# Patient Record
Sex: Male | Born: 1935 | Race: White | Hispanic: No | Smoking: Never smoker
Health system: Southern US, Community
[De-identification: ages and names within clinical notes are randomized; demographics above are authoritative.]

## PROBLEM LIST (undated history)

## (undated) DIAGNOSIS — M19032 Primary osteoarthritis, left wrist: Secondary | ICD-10-CM

## (undated) DIAGNOSIS — N183 Chronic kidney disease, stage 3 unspecified: Secondary | ICD-10-CM

## (undated) DIAGNOSIS — Z8551 Personal history of malignant neoplasm of bladder: Secondary | ICD-10-CM

## (undated) DIAGNOSIS — F0390 Unspecified dementia without behavioral disturbance: Secondary | ICD-10-CM

## (undated) DIAGNOSIS — Z8679 Personal history of other diseases of the circulatory system: Secondary | ICD-10-CM

## (undated) DIAGNOSIS — R32 Unspecified urinary incontinence: Secondary | ICD-10-CM

## (undated) DIAGNOSIS — Z87438 Personal history of other diseases of male genital organs: Secondary | ICD-10-CM

## (undated) DIAGNOSIS — M170 Bilateral primary osteoarthritis of knee: Secondary | ICD-10-CM

## (undated) DIAGNOSIS — Z8639 Personal history of other endocrine, nutritional and metabolic disease: Secondary | ICD-10-CM

## (undated) HISTORY — DX: Personal history of malignant neoplasm of bladder: Z85.51

## (undated) HISTORY — DX: Personal history of other diseases of male genital organs: Z87.438

## (undated) HISTORY — DX: Unspecified dementia, unspecified severity, without behavioral disturbance, psychotic disturbance, mood disturbance, and anxiety: F03.90

## (undated) HISTORY — DX: Unspecified urinary incontinence: R32

## (undated) HISTORY — DX: Personal history of other diseases of the circulatory system: Z86.79

## (undated) HISTORY — DX: Chronic kidney disease, stage 3 unspecified: N18.30

## (undated) HISTORY — PX: CHOLECYSTECTOMY: SHX55

## (undated) HISTORY — DX: Personal history of other endocrine, nutritional and metabolic disease: Z86.39

## (undated) HISTORY — DX: Bilateral primary osteoarthritis of knee: M17.0

## (undated) HISTORY — DX: Primary osteoarthritis, left wrist: M19.032

## (undated) HISTORY — PX: SHOULDER SURGERY: SHX246

## (undated) HISTORY — PX: HERNIA REPAIR: SHX51

## (undated) HISTORY — DX: Chronic kidney disease, stage 3 (moderate): N18.3

---

## 1997-12-27 ENCOUNTER — Ambulatory Visit (HOSPITAL_BASED_OUTPATIENT_CLINIC_OR_DEPARTMENT_OTHER): Admission: RE | Admit: 1997-12-27 | Discharge: 1997-12-27 | Payer: Self-pay | Admitting: Urology

## 1998-04-01 ENCOUNTER — Ambulatory Visit (HOSPITAL_COMMUNITY): Admission: RE | Admit: 1998-04-01 | Discharge: 1998-04-01 | Payer: Self-pay | Admitting: Urology

## 1998-04-08 ENCOUNTER — Ambulatory Visit (HOSPITAL_COMMUNITY): Admission: RE | Admit: 1998-04-08 | Discharge: 1998-04-08 | Payer: Self-pay | Admitting: Urology

## 1998-04-09 ENCOUNTER — Ambulatory Visit (HOSPITAL_COMMUNITY): Admission: RE | Admit: 1998-04-09 | Discharge: 1998-04-09 | Payer: Self-pay | Admitting: Urology

## 1998-04-18 ENCOUNTER — Ambulatory Visit (HOSPITAL_BASED_OUTPATIENT_CLINIC_OR_DEPARTMENT_OTHER): Admission: RE | Admit: 1998-04-18 | Discharge: 1998-04-18 | Payer: Self-pay | Admitting: Urology

## 1998-08-15 ENCOUNTER — Ambulatory Visit (HOSPITAL_COMMUNITY): Admission: RE | Admit: 1998-08-15 | Discharge: 1998-08-15 | Payer: Self-pay | Admitting: Urology

## 1999-05-18 ENCOUNTER — Other Ambulatory Visit: Admission: RE | Admit: 1999-05-18 | Discharge: 1999-05-18 | Payer: Self-pay | Admitting: Urology

## 1999-08-17 ENCOUNTER — Other Ambulatory Visit: Admission: RE | Admit: 1999-08-17 | Discharge: 1999-08-17 | Payer: Self-pay | Admitting: Urology

## 1999-08-18 ENCOUNTER — Other Ambulatory Visit: Admission: RE | Admit: 1999-08-18 | Discharge: 1999-08-18 | Payer: Self-pay | Admitting: Urology

## 2000-12-09 ENCOUNTER — Ambulatory Visit (HOSPITAL_COMMUNITY): Admission: RE | Admit: 2000-12-09 | Discharge: 2000-12-09 | Payer: Self-pay | Admitting: *Deleted

## 2000-12-20 ENCOUNTER — Encounter: Payer: Self-pay | Admitting: *Deleted

## 2000-12-20 ENCOUNTER — Ambulatory Visit (HOSPITAL_COMMUNITY): Admission: RE | Admit: 2000-12-20 | Discharge: 2000-12-20 | Payer: Self-pay | Admitting: *Deleted

## 2001-02-08 ENCOUNTER — Encounter: Payer: Self-pay | Admitting: *Deleted

## 2001-02-08 ENCOUNTER — Ambulatory Visit (HOSPITAL_COMMUNITY): Admission: RE | Admit: 2001-02-08 | Discharge: 2001-02-08 | Payer: Self-pay | Admitting: *Deleted

## 2012-02-07 ENCOUNTER — Emergency Department (HOSPITAL_BASED_OUTPATIENT_CLINIC_OR_DEPARTMENT_OTHER)
Admission: EM | Admit: 2012-02-07 | Discharge: 2012-02-07 | Disposition: A | Discharge status: Home / Self Care | Payer: Medicare Other | Attending: Emergency Medicine | Admitting: Emergency Medicine

## 2012-02-07 ENCOUNTER — Encounter (HOSPITAL_BASED_OUTPATIENT_CLINIC_OR_DEPARTMENT_OTHER): Payer: Self-pay | Admitting: Emergency Medicine

## 2012-02-07 ENCOUNTER — Emergency Department (HOSPITAL_BASED_OUTPATIENT_CLINIC_OR_DEPARTMENT_OTHER): Payer: Medicare Other

## 2012-02-07 DIAGNOSIS — S0191XA Laceration without foreign body of unspecified part of head, initial encounter: Secondary | ICD-10-CM

## 2012-02-07 DIAGNOSIS — Z23 Encounter for immunization: Secondary | ICD-10-CM | POA: Insufficient documentation

## 2012-02-07 DIAGNOSIS — S0190XA Unspecified open wound of unspecified part of head, initial encounter: Secondary | ICD-10-CM | POA: Insufficient documentation

## 2012-02-07 DIAGNOSIS — W19XXXA Unspecified fall, initial encounter: Secondary | ICD-10-CM

## 2012-02-07 DIAGNOSIS — W010XXA Fall on same level from slipping, tripping and stumbling without subsequent striking against object, initial encounter: Secondary | ICD-10-CM | POA: Insufficient documentation

## 2012-02-07 DIAGNOSIS — R51 Headache: Secondary | ICD-10-CM | POA: Insufficient documentation

## 2012-02-07 DIAGNOSIS — Y92009 Unspecified place in unspecified non-institutional (private) residence as the place of occurrence of the external cause: Secondary | ICD-10-CM | POA: Insufficient documentation

## 2012-02-07 MED ORDER — TETANUS-DIPHTH-ACELL PERTUSSIS 5-2.5-18.5 LF-MCG/0.5 IM SUSP
0.5000 mL | Freq: Once | INTRAMUSCULAR | Status: AC
  Administered 2012-02-07: 0.5 mL via INTRAMUSCULAR
  Filled 2012-02-07: qty 0.5

## 2012-02-07 MED ORDER — ACETAMINOPHEN 325 MG PO TABS
650.0000 mg | ORAL_TABLET | Freq: Once | ORAL | Status: AC
  Administered 2012-02-07: 650 mg via ORAL
  Filled 2012-02-07: qty 2

## 2012-02-07 NOTE — ED Provider Notes (Signed)
History     CSN: [id number]  Arrival date & time 02/07/12  7829   First MD Initiated Contact with Patient 02/07/12 [phone]      Chief Complaint  Patient presents with  . Head Injury    (Consider location/radiation/quality/duration/timing/severity/associated sxs/prior treatment) HPI Comments: Joshua Gardner is a pleasant 78 yo gentleman who comes in after an unwitnessed fall that occurred at home this morning. He comes in with his daughter. Patient states he was getting up out of bed and tripped on the rug. He also had a trash can he had moved beside his bed which he may have tripped on also. He states he hit his head on the wall. No other injured sites, no other part of his body is hurting.  Pain is localized in the posterolateral scalp, where he said he split the skin when he fell against the wall. Denies changes in vision, problems speaking or walking, denies loss of consciousness. Patient denies having chest pain, sob, palpitations, or weakness before the fall.   Unsure of when last tetanus shot was administered.   No past medical history on file.  Past Surgical History  Procedure Date  . Hernia repair   . Shoulder surgery     No family history on file.  History  Substance Use Topics  . Smoking status: Not on file  . Smokeless tobacco: Not on file  . Alcohol Use:       Review of Systems  Constitutional: Negative for fever, chills and fatigue.  HENT: Negative for neck pain.   Eyes: Negative for visual disturbance.  Respiratory: Negative for chest tightness.   Cardiovascular: Positive for leg swelling. Negative for chest pain and palpitations.  Gastrointestinal: Negative for abdominal pain and diarrhea.  Genitourinary: Negative for dysuria.  Skin: Negative for rash.  Neurological: Positive for headaches. Negative for speech difficulty and light-headedness.  Psychiatric/Behavioral: Negative for confusion.  All other systems reviewed and are negative.    Allergies    Review of patient's allergies indicates no known allergies.  Home Medications   Current Outpatient Rx  Name Route Sig Dispense Refill  . VITAMIN B-12 1000 MCG PO TABS Oral Take 1,000 mcg by mouth daily.      BP 151/90  Pulse 70  Temp 97.5 F (36.4 C) (Oral)  Resp 16  Ht 5' (1.524 m)  Wt 122 lb (55.339 kg)  BMI 23.83 kg/m2  SpO2 99%  Physical Exam  Constitutional: He is oriented to person, place, and time.  HENT:       4cm long laceration to lateral postero scalp. No active bleeding. No foreign bodies visualized. No visible bone.  Eyes: Pupils are equal, round, and reactive to light.  Neck: Normal range of motion.  Cardiovascular: Normal rate and regular rhythm.  Exam reveals no friction rub.   No murmur heard. Pulmonary/Chest: Effort normal and breath sounds normal. He has no wheezes. He has no rales. He exhibits no tenderness.  Abdominal: Soft. Bowel sounds are normal. There is no tenderness.  Musculoskeletal: He exhibits edema.  Neurological: He is alert and oriented to person, place, and time. No cranial nerve deficit.       Cranial nerves intact Pupils 5 mm and reactive to light    ED Course  LACERATION REPAIR Date/Time: 02/07/2012 11:00 AM Performed by: Larey Seat Authorized by: Cyndra Numbers Consent: Verbal consent obtained. Risks and benefits: risks, benefits and alternatives were discussed Consent given by: patient Patient understanding: patient states understanding of the procedure  being performed Patient identity confirmed: verbally with patient Laceration length: 4 cm Foreign bodies: no foreign bodies Vascular damage: no Irrigation solution: saline Irrigation method: syringe Debridement: none Skin closure: staples Dressing: antibiotic ointment Patient tolerance: Patient tolerated the procedure well with no immediate complications.   (including critical care time)  Labs Reviewed - No data to display Ct Head Wo Contrast  02/07/2012  *RADIOLOGY  REPORT*  Clinical Data: Fall.  Laceration to left side of head.  CT HEAD WITHOUT CONTRAST  Technique:  Contiguous axial images were obtained from the base of the skull through the vertex without contrast.  Comparison: None.  Findings: There is no evidence for acute hemorrhage, hydrocephalus, mass lesion, or abnormal extra-axial fluid collection.  No definite CT evidence for acute infarction.  Diffuse loss of parenchymal volume is consistent with atrophy. Patchy low attenuation in the deep hemispheric and periventricular white matter is nonspecific, but likely reflects chronic microvascular ischemic demyelination.  Chronic mucosal disease is seen in the maxillary sinuses bilaterally.  No evidence for skull fracture.  The gas within the soft tissues of the left parietal scalp suggests laceration.  IMPRESSION: No acute intracranial abnormality.  Atrophy with chronic small vessel white matter ischemic demyelination.  Original Report Authenticated By: ERIC A. MANSELL, M.D.     1. Laceration of head   2. Fall at home       MDM    78 yo male presents after fall at home. Patient is not in acute distress, oriented, alert. No cranial nerve abnormalities. Patient's family states that he is at baseline mental status. History suggestive of mechanical fall.  History not consistent with cardiac etiology or syncope.   Head CT w/o Contrast without evidence of acute bleed, some atrophy noted. No skull fracture noted 4mm posterior head laceration stapled without complication. Wound is clean and hemostasis achieved. Tetanus shot given.  Patient was hypertensive to 191 SBP during his stay in the ED. He was complaining of significant pain the the time of measurement. 30 min after Tylenol administration, his BP dropped to 151/90 and his pain had subsided.  Patient discharged with PRN Tylenol for pain and instructions to f/u with PCP or the ED for staple removal in 7 days and f/u with PCP for hypertension.  Patient  and family voiced understanding to return to the ED if signs of brain injury develop, i.e. Lethargy, trouble speaking, change in gait or alertness level. Also discussed signs and symptoms of infection including fever, chills, purulent drainage, wound tenderness or inflammation. Patient will return if symptoms worsen.         Larey Seat, MD 02/07/12 1233

## 2012-02-07 NOTE — ED Notes (Signed)
Daughter states he tripped on the trash can in the kitchen and fell this am.  C/o laceration to left side of head

## 2012-02-07 NOTE — ED Provider Notes (Signed)
I saw and evaluated the patient, reviewed the resident's note and I agree with the findings and plan.   Cyndra Numbers, MD 02/07/12 [phone]

## 2012-02-15 ENCOUNTER — Emergency Department (HOSPITAL_BASED_OUTPATIENT_CLINIC_OR_DEPARTMENT_OTHER)
Admission: EM | Admit: 2012-02-15 | Discharge: 2012-02-15 | Disposition: A | Discharge status: Home / Self Care | Payer: Medicare Other | Attending: Emergency Medicine | Admitting: Emergency Medicine

## 2012-02-15 ENCOUNTER — Encounter (HOSPITAL_BASED_OUTPATIENT_CLINIC_OR_DEPARTMENT_OTHER): Payer: Self-pay

## 2012-02-15 DIAGNOSIS — Z4802 Encounter for removal of sutures: Secondary | ICD-10-CM | POA: Insufficient documentation

## 2012-02-15 NOTE — ED Notes (Signed)
Staples place to back of head 7/22-denies c/o

## 2012-02-15 NOTE — ED Notes (Signed)
Removed 5 staples from patient's posterior head. Wound clean and dry.

## 2012-02-15 NOTE — ED Provider Notes (Signed)
History     CSN: [id number]  Arrival date & time 02/15/12  1840   First MD Initiated Contact with Patient 02/15/12 1850      No chief complaint on file.   (Consider location/radiation/quality/duration/timing/severity/associated sxs/prior treatment) HPI Patient here for staple removal.  Daughter states no problems with staple placed 7 days ago.  No redness, swelling, and patient at usual mental status.  No past medical history on file.  Past Surgical History  Procedure Date  . Hernia repair   . Shoulder surgery     No family history on file.  History  Substance Use Topics  . Smoking status: Not on file  . Smokeless tobacco: Not on file  . Alcohol Use:       Review of Systems  Constitutional: Negative.   Neurological: Negative.     Allergies  Review of patient's allergies indicates no known allergies.  Home Medications   Current Outpatient Rx  Name Route Sig Dispense Refill  . VITAMIN B-12 1000 MCG PO TABS Oral Take 1,000 mcg by mouth daily.      There were no vitals taken for this visit.  Physical Exam  Nursing note and vitals reviewed. Constitutional: He appears well-developed.  HENT:       Well healing laceration left occiput.     ED Course  Procedures (including critical care time)  Labs Reviewed - No data to display No results found.   No diagnosis found.    MDM  Staples to be removed by tech.        Hilario Quarry, MD 02/15/12 [phone]

## 2012-05-09 ENCOUNTER — Encounter: Payer: Self-pay | Admitting: Family Medicine

## 2012-05-09 ENCOUNTER — Ambulatory Visit (INDEPENDENT_AMBULATORY_CARE_PROVIDER_SITE_OTHER): Payer: Medicare Other | Admitting: Family Medicine

## 2012-05-09 VITALS — BP 122/82 | HR 84 | Temp 98.4°F | Ht 60.0 in | Wt 137.0 lb

## 2012-05-09 DIAGNOSIS — Z23 Encounter for immunization: Secondary | ICD-10-CM

## 2012-05-09 DIAGNOSIS — M170 Bilateral primary osteoarthritis of knee: Secondary | ICD-10-CM

## 2012-05-09 DIAGNOSIS — F0390 Unspecified dementia without behavioral disturbance: Secondary | ICD-10-CM

## 2012-05-09 DIAGNOSIS — IMO0002 Reserved for concepts with insufficient information to code with codable children: Secondary | ICD-10-CM

## 2012-05-09 DIAGNOSIS — I10 Essential (primary) hypertension: Secondary | ICD-10-CM

## 2012-05-09 DIAGNOSIS — R030 Elevated blood-pressure reading, without diagnosis of hypertension: Secondary | ICD-10-CM

## 2012-05-09 DIAGNOSIS — M1710 Unilateral primary osteoarthritis, unspecified knee: Secondary | ICD-10-CM

## 2012-05-09 LAB — CBC WITH DIFFERENTIAL/PLATELET
Basophils Relative: 0.5 % (ref 0.0–3.0)
Eosinophils Absolute: 0.1 10*3/uL (ref 0.0–0.7)
Eosinophils Relative: 1.2 % (ref 0.0–5.0)
Lymphocytes Relative: 10.4 % — ABNORMAL LOW (ref 12.0–46.0)
MCHC: 32.5 g/dL (ref 30.0–36.0)
Monocytes Absolute: 1 10*3/uL (ref 0.1–1.0)
Neutrophils Relative %: 76.4 % (ref 43.0–77.0)
Platelets: 196 10*3/uL (ref 150.0–400.0)
RBC: 4.05 Mil/uL — ABNORMAL LOW (ref 4.22–5.81)
WBC: 8.7 10*3/uL (ref 4.5–10.5)

## 2012-05-09 LAB — POCT URINALYSIS DIPSTICK
Glucose, UA: NEGATIVE
Nitrite, UA: NEGATIVE
Protein, UA: NEGATIVE
Spec Grav, UA: 1.015
Urobilinogen, UA: 0.2

## 2012-05-09 MED ORDER — TRAMADOL HCL 50 MG PO TABS
ORAL_TABLET | ORAL | Status: DC
Start: 2012-05-09 — End: 2012-05-19

## 2012-05-09 NOTE — Patient Instructions (Addendum)
Buy a BP machine with an upper arm cuff --automatic. Check blood pressure and pulse once daily at any time of day and bring a log of these bp readings to next appt. Goal bp is <140 over <90. Call if home bp consistently >160 on top or >100 on bottom. Do not take aspirin yet.

## 2012-05-09 NOTE — Progress Notes (Addendum)
Office Note 05/15/2012  CC:  Chief Complaint  Patient presents with  . Establish Care    discuss home care; discuss seeing things that are not there    HPI:  Joshua Gardner is a 78 y.o. White male who is here with his daughter to establish care. Patient's most recent primary MD: Dr. Tiburcio Pea with [facility] in [place]. Old records in [facility] were reviewed prior to or during today's visit.  He is demonstrating worsening dementia: short term memory problems worsening, poor ability to judge time.  Can't do ADL's.  Has hallucinations of insects moving in the house.  Sees children sitting on his couch but there are none there. His daughter thinks he may be hearing voices "says someone told him that company was coming".  Paranoid about getting up to urinate in the night b/c he thinks other people are there. A trial of seroquel in the recent past was done.  Family was afraid of this med so they d/c'd it. He wears depends b/c lots of times he is unable or unwilling to make it to the bathroom.  MMSE today: about 25/30  Past Medical History  Diagnosis Date  . History of bladder cancer   . Dementia     With hallucinations/paranoia.  July 2013 CT brain: atrophy + small vessel ischemic demyelination  . Osteoarthritis of both knees   . Urinary incontinence     Past Surgical History  Procedure Date  . Hernia repair   . Shoulder surgery     Right  . Cholecystectomy 1980s   FH: noncontributory No family history on file.  History   Social History  . Marital Status: Widowed    Spouse Name: N/A    Number of Children: N/A  . Years of Education: N/A   Occupational History  . Not on file.   Social History Main Topics  . Smoking status: Never Smoker   . Smokeless tobacco: Never Used  . Alcohol Use: No  . Drug Use: No  . Sexually Active: Not on file   Other Topics Concern  . Not on file   Social History Narrative   Widower as of 07/2010, had 4 children, two of which are  living.Lives by himself, next to his daughter.  His granddaughter has been living with him.Essentially confined to wheelchair due to severe knee osteoarthritis.No T/A/Ds.   MEDS: glucosamine chondroitin, loperamide a couple of times a week  No Known Allergies  ROS Review of Systems  Constitutional: Negative for fever and fatigue.  HENT: Negative for congestion and sore throat.   Eyes: Negative for visual disturbance.  Respiratory: Negative for cough.   Cardiovascular: Negative for chest pain.  Gastrointestinal: Negative for nausea and abdominal pain.  Genitourinary: Negative for dysuria.  Musculoskeletal: Negative for back pain and joint swelling.  Skin: Negative for rash.  Neurological: Negative for weakness and headaches.  Hematological: Negative for adenopathy.    PE; Blood pressure 122/82, pulse 84, temperature 98.4 F (36.9 C), temperature source Temporal, height 5' (1.524 m), weight 137 lb (62.143 kg), SpO2 89.00%. Gen: Alert, well appearing.  Patient is oriented to person, place, time, and situation. CV: RRR, no m/r/g.   LUNGS: CTA bilat, nonlabored resps, good aeration in all lung fields. EXT: 2+ LE edema bilat.  Pertinent labs:  None today  ASSESSMENT AND PLAN:   New pt: obtain old records.  Dementia Moderate, but significantly dysfunctional given his paranoia and hallucinations. Possible Lewy body dementia, so will avoid neuroleptics. Will check  labs and urine today.  Will discuss trial of med at next f/u. Needs HH nursing, needs new wheelchair--ordered today.  Osteoarthritis of both knees Avoid NSAIDs.  Tylenol not helpful. Of course, the family and I are hesitant to start narcotic pain med due to potential psychiatric side effects, but he is significantly disabled and we have decided to do a trial of tramadol. Therapeutic expectations and side effect profile of medication discussed today.  Patient's questions answered.   Elevated blood pressure reading  without diagnosis of hypertension Discussed home monitoring.   Flu vaccine IM today.  An After Visit Summary was printed and given to the patient.  Return for 10-14d for f/u dementia and knee pain.

## 2012-05-10 LAB — COMPREHENSIVE METABOLIC PANEL
AST: 29 U/L (ref 0–37)
Albumin: 3.2 g/dL — ABNORMAL LOW (ref 3.5–5.2)
Alkaline Phosphatase: 57 U/L (ref 39–117)
BUN: 32 mg/dL — ABNORMAL HIGH (ref 6–23)
Creatinine, Ser: 1.2 mg/dL (ref 0.4–1.5)
Glucose, Bld: 73 mg/dL (ref 70–99)
Potassium: 4.5 mEq/L (ref 3.5–5.1)
Total Bilirubin: 0.6 mg/dL (ref 0.3–1.2)

## 2012-05-11 ENCOUNTER — Telehealth: Payer: Self-pay | Admitting: Family Medicine

## 2012-05-11 LAB — URINE CULTURE
Colony Count: NO GROWTH
Organism ID, Bacteria: NO GROWTH

## 2012-05-11 LAB — TSH: TSH: 2.11 u[IU]/mL (ref 0.35–5.50)

## 2012-05-11 NOTE — Telephone Encounter (Addendum)
disregard

## 2012-05-14 ENCOUNTER — Encounter: Payer: Self-pay | Admitting: Family Medicine

## 2012-05-14 DIAGNOSIS — I10 Essential (primary) hypertension: Secondary | ICD-10-CM | POA: Insufficient documentation

## 2012-05-14 DIAGNOSIS — F03918 Unspecified dementia, unspecified severity, with other behavioral disturbance: Secondary | ICD-10-CM | POA: Insufficient documentation

## 2012-05-14 DIAGNOSIS — F0391 Unspecified dementia with behavioral disturbance: Secondary | ICD-10-CM | POA: Insufficient documentation

## 2012-05-14 DIAGNOSIS — M170 Bilateral primary osteoarthritis of knee: Secondary | ICD-10-CM | POA: Insufficient documentation

## 2012-05-14 NOTE — Assessment & Plan Note (Addendum)
Moderate, but significantly dysfunctional given his paranoia and hallucinations. Possible Lewy body dementia, so will avoid neuroleptics. Will check labs and urine today.  Will discuss trial of med at next f/u. Needs HH nursing, needs new wheelchair--ordered today.

## 2012-05-14 NOTE — Assessment & Plan Note (Signed)
Discussed home monitoring.

## 2012-05-14 NOTE — Assessment & Plan Note (Signed)
Avoid NSAIDs.  Tylenol not helpful. Of course, the family and I are hesitant to start narcotic pain med due to potential psychiatric side effects, but he is significantly disabled and we have decided to do a trial of tramadol. Therapeutic expectations and side effect profile of medication discussed today.  Patient's questions answered.

## 2012-05-17 ENCOUNTER — Telehealth: Payer: Self-pay | Admitting: Family Medicine

## 2012-05-17 NOTE — Telephone Encounter (Signed)
Joshua Gardner advised per Dr. Milinda Cave that tramadol can effect dementia.  They should stop tramadol and see if symptoms get better.  She is agreeable.

## 2012-05-19 ENCOUNTER — Ambulatory Visit (INDEPENDENT_AMBULATORY_CARE_PROVIDER_SITE_OTHER): Payer: Medicare Other | Admitting: Family Medicine

## 2012-05-19 ENCOUNTER — Encounter: Payer: Self-pay | Admitting: Family Medicine

## 2012-05-19 VITALS — BP 140/86 | HR 82 | Ht 60.0 in | Wt 139.0 lb

## 2012-05-19 DIAGNOSIS — I8310 Varicose veins of unspecified lower extremity with inflammation: Secondary | ICD-10-CM

## 2012-05-19 DIAGNOSIS — M170 Bilateral primary osteoarthritis of knee: Secondary | ICD-10-CM

## 2012-05-19 DIAGNOSIS — F0390 Unspecified dementia without behavioral disturbance: Secondary | ICD-10-CM

## 2012-05-19 DIAGNOSIS — M1710 Unilateral primary osteoarthritis, unspecified knee: Secondary | ICD-10-CM

## 2012-05-19 DIAGNOSIS — R030 Elevated blood-pressure reading, without diagnosis of hypertension: Secondary | ICD-10-CM

## 2012-05-19 DIAGNOSIS — I872 Venous insufficiency (chronic) (peripheral): Secondary | ICD-10-CM

## 2012-05-19 MED ORDER — AMOXICILLIN-POT CLAVULANATE 500-125 MG PO TABS: ORAL_TABLET | ORAL | Status: DC

## 2012-05-19 MED ORDER — DONEPEZIL HCL 5 MG PO TABS: 5.0000 mg | ORAL_TABLET | Freq: Every evening | ORAL | Status: DC | PRN

## 2012-05-19 NOTE — Progress Notes (Signed)
OFFICE NOTE  05/19/2012  CC:  Chief Complaint  Patient presents with  . Follow-up    dementia, pain; fell last night     HPI: Patient is a 78 y.o. Caucasian male who is here for 10 d f/u dementia and chronic bilateral knee osteoarthritic pain.  Tried tramadol after last visit but he seemed to have gradual cognitive worsening after several consecutive doses.  He has returned to baseline since stopping this med. Daughter wants to try max dose tylenol for his knee pain.  Daughter bought bp cuff for home monitoring and we reviewed numbers today: mostly normal but an occasional diastolic in the 90s is noted.  Daughter and pt are open to trial of aricept (3+ yrs of gradually progressive memory impairment, visual and auditory hallucinations, inability to perform ADLs)--for suspected Lewy body dementia. Fell yesterday when trying to get to the door to let his son-in-law in.  Hit right shoulder and upper arm and head.  NO LOC.  Has no c/o shoulder or arm pain since then and is moving them normal per daughter.  Has a skin tear on right arm.  HH nursing has been ordered but insurance probs have prevented this from being done as of this moment--still working on this. His lower legs have been swelling worse lately, some redness of skin is more apparent to daughter lately.  He does not limit his sodium intake and does not elevate his legs.  He is too uncoordinated to wear compression hose plus daughter says he can't be trusted --"he may take a knife to them to try to get them off".  Pertinent PMH:  Past Medical History  Diagnosis Date  . History of bladder cancer   . Dementia     With hallucinations/paranoia.  July 2013 CT brain: atrophy + small vessel ischemic demyelination  . Osteoarthritis of both knees   . Urinary incontinence    Past surgical, social, and family history reviewed and no changes noted since last office visit.  MEDS:  Imodim 2mg  qd prn, vitamin b12 1000 mcg PO qd  PE: Blood  pressure 140/86, pulse 82, height 5' (1.524 m), weight 139 lb (63.05 kg), SpO2 96.00%. Gen: Alert, well appearing.  Patient is oriented to person, place, time, and situation. Uses rolling walking to ambulate (slowly). Right arm with large skin tear in distal upper arm, slight oozing of blood noted after large band-aid was taken off today.  No surrounding erythema.   Humerus and shoulder are minimally tender to palpation.  He has full ROM of his shoulder and arm. LEGS: Knees show no erythema, tenderness, or warmth.  Right knee a bit larger than left but I feel no significant effusion.   3+ pitting edema in RLE, 2+ in LLE, with diffuse warmth and pinkish hue to anterior surface of distal tibial surfaces bilat.  Borders of this erythema are indistinct.  No tenderness to gentle palpation. MUSC: no cogwheeling in arms/wrists.  Mild UE tremor with hands held outstretched.  IMPRESSION AND PLAN:  Dementia Given the extent of his hallucinations I have high suspicion of Lewy body dementia.  No significant parkinsonism features at this time, though. Avoid atypical antipsychotics. Trial of aricept 5mg  started today.  Therapeutic expectations and side effect profile of medication discussed today.  Patient's questions answered. Still working on [facility] nursing vs NH placement. Wheelchair rx given today.  Osteoarthritis of both knees Due to side effect from tramadol (and potential for worse side effects from stronger pain meds), we are  restricted to use of max dose tylenol at this time.  Continue with walker and prn wheelchair use.  He is definitely a high fall risk. Has minimal injury from recent fall--continue first-aid care to right arm skin tear.  Elevated blood pressure reading without diagnosis of hypertension Diastolics in stage 1 range sometimes.  Watchful waiting for now.    Venous stasis dermatitis Vs cellulitis. Will start augmentin 500 mg bid x 10d. Reviewed importance of elevation of legs,  limitation of sodium intake.  I'm hesitant to use diuretics b/c I want to avoid chance of dehydration/orthostatic hypotension with him but if skin becomes tense in LE's will use some lasix sparingly.     FOLLOW UP: 10d

## 2012-05-20 DIAGNOSIS — I872 Venous insufficiency (chronic) (peripheral): Secondary | ICD-10-CM | POA: Insufficient documentation

## 2012-05-20 NOTE — Assessment & Plan Note (Signed)
Given the extent of his hallucinations I have high suspicion of Lewy body dementia.  No significant parkinsonism features at this time, though. Avoid atypical antipsychotics. Trial of aricept 5mg  started today.  Therapeutic expectations and side effect profile of medication discussed today.  Patient's questions answered. Still working on [facility] nursing vs NH placement. Wheelchair rx given today.

## 2012-05-20 NOTE — Assessment & Plan Note (Signed)
Vs cellulitis. Will start augmentin 500 mg bid x 10d. Reviewed importance of elevation of legs, limitation of sodium intake.  I'm hesitant to use diuretics b/c I want to avoid chance of dehydration/orthostatic hypotension with him but if skin becomes tense in LE's will use some lasix sparingly.

## 2012-05-20 NOTE — Assessment & Plan Note (Signed)
Due to side effect from tramadol (and potential for worse side effects from stronger pain meds), we are restricted to use of max dose tylenol at this time.  Continue with walker and prn wheelchair use.  He is definitely a high fall risk. Has minimal injury from recent fall--continue first-aid care to right arm skin tear.

## 2012-05-20 NOTE — Assessment & Plan Note (Signed)
Diastolics in stage 1 range sometimes.  Watchful waiting for now.

## 2012-05-22 ENCOUNTER — Other Ambulatory Visit: Payer: Self-pay | Admitting: Family Medicine

## 2012-05-22 DIAGNOSIS — M1710 Unilateral primary osteoarthritis, unspecified knee: Secondary | ICD-10-CM

## 2012-05-29 ENCOUNTER — Encounter: Payer: Self-pay | Admitting: Family Medicine

## 2012-05-31 ENCOUNTER — Ambulatory Visit (INDEPENDENT_AMBULATORY_CARE_PROVIDER_SITE_OTHER): Payer: Medicare Other | Admitting: Family Medicine

## 2012-05-31 ENCOUNTER — Encounter: Payer: Self-pay | Admitting: Family Medicine

## 2012-05-31 VITALS — BP 120/76 | HR 74 | Ht 60.0 in | Wt 146.0 lb

## 2012-05-31 DIAGNOSIS — I872 Venous insufficiency (chronic) (peripheral): Secondary | ICD-10-CM

## 2012-05-31 DIAGNOSIS — F0390 Unspecified dementia without behavioral disturbance: Secondary | ICD-10-CM

## 2012-05-31 DIAGNOSIS — N471 Phimosis: Secondary | ICD-10-CM

## 2012-05-31 DIAGNOSIS — M1710 Unilateral primary osteoarthritis, unspecified knee: Secondary | ICD-10-CM

## 2012-05-31 DIAGNOSIS — N478 Other disorders of prepuce: Secondary | ICD-10-CM

## 2012-05-31 DIAGNOSIS — I8310 Varicose veins of unspecified lower extremity with inflammation: Secondary | ICD-10-CM

## 2012-05-31 DIAGNOSIS — M170 Bilateral primary osteoarthritis of knee: Secondary | ICD-10-CM

## 2012-05-31 NOTE — Assessment & Plan Note (Signed)
I think his skin improved a little bit with the antibiotics but not much.  Likely he had no cellulitis and this is all chronic venous stasis dermatitis. No acute treatment needed at this time.  Encouraged compliance with elevation of legs, low Na diet, monitor skin for worrisome changes.

## 2012-05-31 NOTE — Assessment & Plan Note (Signed)
Unclear how long this has been present, but his foreskin is clearly not retractable in the office today. When he urinates, he has ballooning of the foreskin and then says urine eventually dribbles out.  He denies significant current discomfort as a result of this, but I think this needs to be addressed/alleviated ASAP.  We have talked to [facility] today and they have set him up to see a urologist at [facility] tomorrow morning.

## 2012-05-31 NOTE — Progress Notes (Signed)
OFFICE NOTE  05/31/2012  CC:  Chief Complaint  Patient presents with  . Follow-up    dementia     HPI: Patient is a 78 y.o. Caucasian male who is here for 2 week f/u dementia and bilaterall knee osteoarthritic pain. Started aricept 5mg  last visit, also started augmentin for possible LE venous stasis cellulitis.   He is tolerating the aricept fine, daughter thinks it is even helping a little.   Legs not much different from the recently finished course of antibiotics.  His knees feel a bit improved using tylenol 1000mg  bid with meals.  Increased frequency of urination the last 2 wks, question of swelling on penis, patient asks me to take a look today. No dysuria.  No abd pains.  He describes the foreskin ballooning up when he urinates and this slowly goes down over time.  Pertinent PMH:  Past Medical History  Diagnosis Date  . History of bladder cancer   . Dementia     With hallucinations/paranoia.  July 2013 CT brain: atrophy + small vessel ischemic demyelination  . Osteoarthritis of both knees   . Urinary incontinence   . History of non anemic vitamin B12 deficiency 08/2011 (level was 171)    Vit B12 level normal (466) 11/2011 at previous PCP.  Marland Kitchen DM2 (diabetes mellitus, type 2)     Diet controlled (per old records with [facility]).  . History of hypothyroidism     currently off meds  . History of hypertension     Normotensive off meds as of 11/2011  . Chronic renal insufficiency, stage III (moderate)     Borderline stage II/III (Est CrCl about 60).   Past Surgical History  Procedure Date  . Hernia repair   . Shoulder surgery     Right  . Cholecystectomy 1980s   History   Social History  . Marital Status: Widowed    Spouse Name: N/A    Number of Children: N/A  . Years of Education: N/A   Occupational History  . Not on file.   Social History Main Topics  . Smoking status: Never Smoker   . Smokeless tobacco: Never Used  . Alcohol Use: No  . Drug Use: No  . Sexually  Active: Not on file   Other Topics Concern  . Not on file   Social History Narrative   Widower as of 07/2010, had 4 children, two of which are living.Lives by himself, next to his daughter.  His granddaughter has been living with him.Essentially confined to wheelchair due to severe knee osteoarthritis.No T/A/Ds.    MEDS:  Outpatient Prescriptions Prior to Visit  Medication Sig Dispense Refill  . donepezil (ARICEPT) 5 MG tablet Take 1 tablet (5 mg total) by mouth at bedtime as needed.  30 tablet  1  . loperamide (IMODIUM) 2 MG capsule Take 2 mg by mouth 2 (two) times a week.      . vitamin B-12 (CYANOCOBALAMIN) 1000 MCG tablet Take 1,000 mcg by mouth daily.      . [DISCONTINUED] amoxicillin-clavulanate (AUGMENTIN) 500-125 MG per tablet 1 tab po bid x 10d  20 tablet  0   Last reviewed on 05/31/2012  4:20 PM by Jeoffrey Massed, MD  PE: Blood pressure 120/76, height 5' (1.524 m), weight 146 lb (66.225 kg). Gen: Alert, well appearing.  Patient is oriented to person, answers questions hesitantly. CV: RRR, no m/r/g.   LUNGS: CTA bilat, nonlabored resps, good aeration in all lung fields. ABD: soft, NT/ND, BS +  EXT: 3+ pitting edema bilat, mild erythema of ankles but no skin breakdown and minimal warmth GU: Penile exam shows markedly edematous foreskin that precludes visualization of the penile head or shaft.  No foreskin rash or erythema.  Attempts to retract the foreskin standing and when supine today in office were unsuccessful.  Scrotum exam shows bilateral enlargement c/w hydroceles, nontender.     IMPRESSION AND PLAN:  Acquired phimosis Unclear how long this has been present, but his foreskin is clearly not retractable in the office today. When he urinates, he has ballooning of the foreskin and then says urine eventually dribbles out.  He denies significant current discomfort as a result of this, but I think this needs to be addressed/alleviated ASAP.  We have talked to [facility]  today and they have set him up to see a urologist at [facility] tomorrow morning.  Dementia Stable, tolerating aricept 5mg  so far.  Continue this med.  Osteoarthritis of both knees Doing ok on tylenol 1000 mg bid at this time.  Venous stasis dermatitis I think his skin improved a little bit with the antibiotics but not much.  Likely he had no cellulitis and this is all chronic venous stasis dermatitis. No acute treatment needed at this time.  Encouraged compliance with elevation of legs, low Na diet, monitor skin for worrisome changes.   An After Visit Summary was printed and given to the patient.  FOLLOW UP: 1 mo

## 2012-05-31 NOTE — Assessment & Plan Note (Signed)
Stable, tolerating aricept 5mg  so far.  Continue this med.

## 2012-05-31 NOTE — Assessment & Plan Note (Signed)
Doing ok on tylenol 1000 mg bid at this time.

## 2012-06-03 ENCOUNTER — Emergency Department (HOSPITAL_COMMUNITY): Payer: Medicare Other

## 2012-06-03 ENCOUNTER — Observation Stay (HOSPITAL_COMMUNITY)
Admission: EM | Admit: 2012-06-03 | Discharge: 2012-06-06 | Payer: Medicare Other | Attending: Internal Medicine | Admitting: Internal Medicine

## 2012-06-03 ENCOUNTER — Encounter (HOSPITAL_COMMUNITY): Payer: Self-pay | Admitting: *Deleted

## 2012-06-03 DIAGNOSIS — M1710 Unilateral primary osteoarthritis, unspecified knee: Secondary | ICD-10-CM | POA: Insufficient documentation

## 2012-06-03 DIAGNOSIS — R5381 Other malaise: Secondary | ICD-10-CM | POA: Insufficient documentation

## 2012-06-03 DIAGNOSIS — D649 Anemia, unspecified: Secondary | ICD-10-CM | POA: Diagnosis present

## 2012-06-03 DIAGNOSIS — R030 Elevated blood-pressure reading, without diagnosis of hypertension: Secondary | ICD-10-CM

## 2012-06-03 DIAGNOSIS — E860 Dehydration: Principal | ICD-10-CM | POA: Diagnosis present

## 2012-06-03 DIAGNOSIS — IMO0002 Reserved for concepts with insufficient information to code with codable children: Secondary | ICD-10-CM | POA: Diagnosis present

## 2012-06-03 DIAGNOSIS — Z23 Encounter for immunization: Secondary | ICD-10-CM | POA: Insufficient documentation

## 2012-06-03 DIAGNOSIS — R197 Diarrhea, unspecified: Secondary | ICD-10-CM | POA: Diagnosis present

## 2012-06-03 DIAGNOSIS — F0391 Unspecified dementia with behavioral disturbance: Secondary | ICD-10-CM | POA: Diagnosis present

## 2012-06-03 DIAGNOSIS — R627 Adult failure to thrive: Secondary | ICD-10-CM | POA: Insufficient documentation

## 2012-06-03 DIAGNOSIS — N182 Chronic kidney disease, stage 2 (mild): Secondary | ICD-10-CM | POA: Diagnosis present

## 2012-06-03 DIAGNOSIS — L259 Unspecified contact dermatitis, unspecified cause: Secondary | ICD-10-CM | POA: Insufficient documentation

## 2012-06-03 DIAGNOSIS — M170 Bilateral primary osteoarthritis of knee: Secondary | ICD-10-CM

## 2012-06-03 DIAGNOSIS — I10 Essential (primary) hypertension: Secondary | ICD-10-CM | POA: Diagnosis present

## 2012-06-03 DIAGNOSIS — F0390 Unspecified dementia without behavioral disturbance: Secondary | ICD-10-CM | POA: Insufficient documentation

## 2012-06-03 DIAGNOSIS — I872 Venous insufficiency (chronic) (peripheral): Secondary | ICD-10-CM | POA: Diagnosis present

## 2012-06-03 DIAGNOSIS — F03918 Unspecified dementia, unspecified severity, with other behavioral disturbance: Secondary | ICD-10-CM | POA: Diagnosis present

## 2012-06-03 DIAGNOSIS — N471 Phimosis: Secondary | ICD-10-CM | POA: Diagnosis present

## 2012-06-03 LAB — URINALYSIS, ROUTINE W REFLEX MICROSCOPIC
Bilirubin Urine: NEGATIVE
Glucose, UA: NEGATIVE mg/dL
Protein, ur: NEGATIVE mg/dL
Specific Gravity, Urine: 1.022 (ref 1.005–1.030)

## 2012-06-03 LAB — CBC WITH DIFFERENTIAL/PLATELET
Eosinophils Absolute: 0.3 10*3/uL (ref 0.0–0.7)
Eosinophils Relative: 5 % (ref 0–5)
HCT: 30.6 % — ABNORMAL LOW (ref 39.0–52.0)
Hemoglobin: 10.2 g/dL — ABNORMAL LOW (ref 13.0–17.0)
Lymphocytes Relative: 12 % (ref 12–46)
Lymphs Abs: 0.8 10*3/uL (ref 0.7–4.0)
MCH: 32.3 pg (ref 26.0–34.0)
MCV: 96.8 fL (ref 78.0–100.0)
Monocytes Relative: 14 % — ABNORMAL HIGH (ref 3–12)
Platelets: 148 10*3/uL — ABNORMAL LOW (ref 150–400)
RBC: 3.16 MIL/uL — ABNORMAL LOW (ref 4.22–5.81)
WBC: 6.3 10*3/uL (ref 4.0–10.5)

## 2012-06-03 LAB — BASIC METABOLIC PANEL
BUN: 31 mg/dL — ABNORMAL HIGH (ref 6–23)
CO2: 24 mEq/L (ref 19–32)
Calcium: 8.1 mg/dL — ABNORMAL LOW (ref 8.4–10.5)
Glucose, Bld: 81 mg/dL (ref 70–99)
Sodium: 142 mEq/L (ref 135–145)

## 2012-06-03 LAB — URINE MICROSCOPIC-ADD ON

## 2012-06-03 MED ORDER — ONDANSETRON HCL 4 MG PO TABS: 4.0000 mg | ORAL_TABLET | Freq: Four times a day (QID) | ORAL | Status: DC | PRN

## 2012-06-03 MED ORDER — ONDANSETRON HCL 4 MG/2ML IJ SOLN: 4.0000 mg | Freq: Four times a day (QID) | INTRAMUSCULAR | Status: DC | PRN

## 2012-06-03 MED ORDER — ENOXAPARIN SODIUM 40 MG/0.4ML ~~LOC~~ SOLN
40.0000 mg | SUBCUTANEOUS | Status: DC
  Administered 2012-06-04 – 2012-06-05 (×2): 40 mg via SUBCUTANEOUS
  Filled 2012-06-03 (×4): qty 0.4

## 2012-06-03 MED ORDER — SODIUM CHLORIDE 0.9 % IV SOLN: Freq: Once | INTRAVENOUS | Status: DC

## 2012-06-03 MED ORDER — SODIUM CHLORIDE 0.9 % IV BOLUS (SEPSIS)
500.0000 mL | Freq: Once | INTRAVENOUS | Status: DC
  Administered 2012-06-03: 500 mL via INTRAVENOUS

## 2012-06-03 MED ORDER — ENSURE COMPLETE PO LIQD
237.0000 mL | Freq: Two times a day (BID) | ORAL | Status: DC
  Administered 2012-06-04 – 2012-06-05 (×4): 237 mL via ORAL

## 2012-06-03 MED ORDER — SODIUM CHLORIDE 0.9 % IV SOLN
INTRAVENOUS | Status: AC
  Administered 2012-06-03 – 2012-06-04 (×2): via INTRAVENOUS

## 2012-06-03 MED ORDER — DONEPEZIL HCL 5 MG PO TABS
5.0000 mg | ORAL_TABLET | Freq: Every evening | ORAL | Status: DC | PRN
  Filled 2012-06-03 (×2): qty 1

## 2012-06-03 MED ORDER — NAPROXEN 500 MG PO TABS
500.0000 mg | ORAL_TABLET | Freq: Two times a day (BID) | ORAL | Status: DC
  Administered 2012-06-04 – 2012-06-06 (×4): 500 mg via ORAL
  Filled 2012-06-03 (×7): qty 1

## 2012-06-03 MED ORDER — VITAMIN B-12 1000 MCG PO TABS
1000.0000 ug | ORAL_TABLET | Freq: Every day | ORAL | Status: DC
  Administered 2012-06-04 – 2012-06-06 (×3): 1000 ug via ORAL
  Filled 2012-06-03 (×3): qty 1

## 2012-06-03 NOTE — ED Notes (Signed)
Pt. Has not had any stool to collect for a Clostridium Difficile.

## 2012-06-03 NOTE — ED Provider Notes (Signed)
History     CSN: [id number]  Arrival date & time 06/03/12  1300   First MD Initiated Contact with Patient 06/03/12 1345      Chief Complaint  Patient presents with  . Diarrhea    (Consider location/radiation/quality/duration/timing/severity/associated sxs/prior treatment) HPI Comments: History obtained nearly patient's daughter d/t level V caveat with baseline dementia.  Patient is an 78 year old male with a history of diabetes, dementia, renal insufficiency and chronic diarrhea that presents emergency department with primary concern the patient no longer having the ability to care for himself at home where she lives alone.  Daughter reports that she checks on him before she goes to work and again after and has found him on the floor multiple times from increased falls over the last week and with urinary incontinence.  She also states concerned because she was recently placed on Augmentin for lower leg edema and that since starting the antibiotic his chronic diarrhea consistency he has changed with increased frequency and becoming more watery, but denies seeing mucous in stools.  Patient reports that she lost her mother to C. difficile last year and a consistency of diarrhea with similar.  She also reports increased fatigue, weakness, decreased appetite and decreased cognition.  She denies fevers and cough or abdominal complaints. Pt is currently FULL CODE, as resuscitation has never been discussed.   Patient is a 78 y.o. male presenting with diarrhea. The history is provided by the patient, a relative and medical records. The history is limited by the condition of the patient.  Diarrhea The primary symptoms include diarrhea.    Past Medical History  Diagnosis Date  . History of bladder cancer   . Dementia     With hallucinations/paranoia.  July 2013 CT brain: atrophy + small vessel ischemic demyelination  . Osteoarthritis of both knees   . Urinary incontinence   . History of non anemic  vitamin B12 deficiency 08/2011 (level was 171)    Vit B12 level normal (466) 11/2011 at previous PCP.  Marland Kitchen DM2 (diabetes mellitus, type 2)     Diet controlled (per old records with [facility]).  . History of hypothyroidism     currently off meds  . History of hypertension     Normotensive off meds as of 11/2011  . Chronic renal insufficiency, stage III (moderate)     Borderline stage II/III (Est CrCl about 60).    Past Surgical History  Procedure Date  . Hernia repair   . Shoulder surgery     Right  . Cholecystectomy 1980s    History reviewed. No pertinent family history.  History  Substance Use Topics  . Smoking status: Never Smoker   . Smokeless tobacco: Never Used  . Alcohol Use: No      Review of Systems  Unable to perform ROS: Dementia  Gastrointestinal: Positive for diarrhea.    Allergies  Keflex  Home Medications   Current Outpatient Rx  Name  Route  Sig  Dispense  Refill  . ACETAMINOPHEN ER 650 MG PO TBCR   Oral   Take 650 mg by mouth every 8 (eight) hours as needed. Pain         . AMOXICILLIN-POT CLAVULANATE 875-125 MG PO TABS   Oral   Take 1 tablet by mouth 2 (two) times daily. Pt completed therapy on 05-30-12. For 10 day course         . DONEPEZIL HCL 5 MG PO TABS   Oral   Take 1 tablet (5 mg  total) by mouth at bedtime as needed.   30 tablet   1   . LOPERAMIDE HCL 2 MG PO CAPS   Oral   Take 2 mg by mouth 2 (two) times a week.         Marland Kitchen NAPROXEN 500 MG PO TABS   Oral   Take 500 mg by mouth 2 (two) times daily with a meal. Pain         . VITAMIN B-12 1000 MCG PO TABS   Oral   Take 1,000 mcg by mouth daily.           BP 158/68  Pulse 61  Temp 97.5 F (36.4 C) (Oral)  Resp 16  SpO2 96%  Physical Exam  Nursing note and vitals reviewed. Constitutional: He appears well-developed. He appears listless. No distress.       Hypertensive, thin appearing, listless, cooropitive  HENT:  Head: Normocephalic and atraumatic.  Eyes:  Conjunctivae normal and EOM are normal.  Neck: Normal range of motion.  Cardiovascular:       RRR, intact distal pulses, right leg w 2+ pitting edema.  Pulmonary/Chest: Effort normal.       LCAB  Abdominal:       Soft, non tender  Musculoskeletal: Normal range of motion.  Neurological: He appears listless. GCS eye subscore is 4. GCS verbal subscore is 5. GCS motor subscore is 6.       Generalized weakness, unable to ambulate d/t instability/weakness. Sensory & CN III-XII intact.   Skin: Skin is warm and dry. No rash noted. He is not diaphoretic.       Anterior left lower extremity w erythema on shin, edema, small pustule in center  Psychiatric: He is slowed. Cognition and memory are impaired.    ED Course  Procedures (including critical care time)  Labs Reviewed  CBC WITH DIFFERENTIAL - Abnormal; Notable for the following:    RBC 3.16 (*)     Hemoglobin 10.2 (*)     HCT 30.6 (*)     Platelets 148 (*)     Monocytes Relative 14 (*)     All other components within normal limits  BASIC METABOLIC PANEL - Abnormal; Notable for the following:    BUN 31 (*)     Calcium 8.1 (*)     GFR calc non Af Amer 49 (*)     GFR calc Af Amer 57 (*)     All other components within normal limits  URINALYSIS, ROUTINE W REFLEX MICROSCOPIC - Abnormal; Notable for the following:    APPearance CLOUDY (*)     Hgb urine dipstick SMALL (*)     All other components within normal limits  URINE MICROSCOPIC-ADD ON  CLOSTRIDIUM DIFFICILE BY PCR   Ct Head Wo Contrast  06/03/2012  *RADIOLOGY REPORT*  Clinical Data: New onset of weakness.  CT HEAD WITHOUT CONTRAST  Technique:  Contiguous axial images were obtained from the base of the skull through the vertex without contrast.  Comparison: 02/07/2012  Findings: The patient has diffuse cerebral atrophy.  There is no evidence to suggest an intracranial hemorrhage, mass lesion, midline shift, hydrocephalus or large infarct. There is diffuse mucosal disease in the  paranasal sinuses and there may be a small osteoid osteoma in the left frontal ethmoid region.  Cannot exclude partial ethmoidectomy.  No acute bony abnormality.  IMPRESSION: Stable head CT findings.  No acute intracranial abnormality.  Paranasal sinus disease.   Original Report Authenticated By: Richarda Overlie,  M.D.      1. Diarrhea   2. Dehydration   3. Dementia       MDM  78 yo M w hx of dementia, DM, HTN, & bladder CA presents to ER w FTT, increased falls & worsened diarrhea. Pt recently started on Augmentin for lower leg edema. LAbs abd imaging reviewed. BP 158/68  Pulse 61  Temp 97.5 F (36.4 C) (Oral)  Resp 16  SpO2 96% Pt to be admitted for observation IVF & placement as he can no longer care for himself at home. The patient appears reasonably stabilized for admission considering the current resources, flow, and capabilities available in the ED at this time, and I doubt any other [facility] requiring further screening and/or treatment in the ED prior to admission.           Jaci Carrel, [place] 06/03/12 1901

## 2012-06-03 NOTE — H&P (Signed)
Patient's PCP: Jeoffrey Massed, MD  Chief Complaint: Weakness and diarrhea  History of Present Illness: Joshua Gardner is a 78 y.o. Caucasian male with history of dementia, history of bladder cancer, osteoarthritis, chronic diarrhea on Imodium, diet controlled diabetes, hypothyroidism off medications, hypertension off antihypertensive medications, and chronic kidney disease stage III who presents with the above complaints.  Due to patient's dementia most of the history was provided by patient's daughter.  She indicated that patient had chronic lower extremity venous stasis changes especially in the right lower extremity with some erythema for which he was started on Augmentin on 05/20/2012 which he completed a ten-day course on 05/30/2012.  Over the last 2 days he had change in his diarrhea and has become more watery.  His wife who recently passed away had C. difficile and daughter noted that the diarrhea that patient is having has features similar to C. difficile.  She has also noted that he has had increasing weakness falls at home and loss of appetite.  Patient denies any chest pain, shortness of breath, abdominal pain, headaches or vision changes.  Past Medical History  Diagnosis Date  . History of bladder cancer   . Dementia     With hallucinations/paranoia.  July 2013 CT brain: atrophy + small vessel ischemic demyelination  . Osteoarthritis of both knees   . Urinary incontinence   . History of non anemic vitamin B12 deficiency 08/2011 (level was 171)    Vit B12 level normal (466) 11/2011 at previous PCP.  Marland Kitchen DM2 (diabetes mellitus, type 2)     Diet controlled (per old records with [facility]).  . History of hypothyroidism     currently off meds  . History of hypertension     Normotensive off meds as of 11/2011  . Chronic renal insufficiency, stage III (moderate)     Borderline stage II/III (Est CrCl about 60).   Past Surgical History  Procedure Date  . Hernia repair   . Shoulder surgery       Right  . Cholecystectomy 1980s   Family History  Problem Relation Age of Onset  . Congestive Heart Failure Mother    History   Social History  . Marital Status: Widowed    Spouse Name: N/A    Number of Children: N/A  . Years of Education: N/A   Occupational History  . Not on file.   Social History Main Topics  . Smoking status: Never Smoker   . Smokeless tobacco: Never Used  . Alcohol Use: No  . Drug Use: No  . Sexually Active: Not on file   Other Topics Concern  . Not on file   Social History Narrative   Widower as of 07/2010, had 4 children, two of which are living.Lives by himself, next to his daughter.  His granddaughter has been living with him.Essentially confined to wheelchair due to severe knee osteoarthritis.No T/A/Ds.   Allergies: Keflex  Home Meds: Prior to Admission medications   Medication Sig Start Date End Date Taking? Authorizing Provider  acetaminophen (ARTHRITIS PAIN RELIEF) 650 MG CR tablet Take 650 mg by mouth every 8 (eight) hours as needed. Pain   Yes Historical Provider, MD  amoxicillin-clavulanate (AUGMENTIN) 875-125 MG per tablet Take 1 tablet by mouth 2 (two) times daily. Pt completed therapy on 05-30-12. For 10 day course   Yes Historical Provider, MD  donepezil (ARICEPT) 5 MG tablet Take 1 tablet (5 mg total) by mouth at bedtime as needed. 05/19/12  Yes Maryjean Morn McGowen,  MD  loperamide (IMODIUM) 2 MG capsule Take 2 mg by mouth 2 (two) times a week.   Yes Historical Provider, MD  naproxen (NAPROSYN) 500 MG tablet Take 500 mg by mouth 2 (two) times daily with a meal. Pain   Yes Historical Provider, MD  vitamin B-12 (CYANOCOBALAMIN) 1000 MCG tablet Take 1,000 mcg by mouth daily.   Yes Historical Provider, MD    Review of Systems: All systems reviewed with the patient and positive as per history of present illness, otherwise all other systems are negative.  Physical Exam: Blood pressure 158/68, pulse 61, temperature 97.5 F (36.4 C),  temperature source Oral, resp. rate 16, SpO2 96.00%. General: Awake, Oriented to self and city, not oriented to time or building, No acute distress. HEENT: EOMI, dry mucous membranes Neck: Supple CV: S1 and S2 Lungs: Clear to ascultation bilaterally Abdomen: Soft, Nontender, Nondistended, +bowel sounds. Ext: Good pulses. Trace edema. No clubbing or cyanosis noted.  Small ulcerated skin lesion on the right anterior shin covered in bandage with slight surrounding erythema. Neuro: Cranial Nerves II-XII grossly intact. Has 5/5 motor strength in upper and lower extremities.  Lab results:  [facility] 06/03/12 1729  NA 142  K 4.3  CL 112  CO2 24  GLUCOSE 81  BUN 31*  CREATININE 1.26  CALCIUM 8.1*  MG --  PHOS --   No results found for this basename: AST:2,ALT:2,ALKPHOS:2,BILITOT:2,PROT:2,ALBUMIN:2 in the last 72 hours No results found for this basename: LIPASE:2,AMYLASE:2 in the last 72 hours  Basename 06/03/12 1729  WBC 6.3  NEUTROABS 4.3  HGB 10.2*  HCT 30.6*  MCV 96.8  PLT 148*   No results found for this basename: CKTOTAL:3,CKMB:3,CKMBINDEX:3,TROPONINI:3 in the last 72 hours No components found with this basename: POCBNP:3 No results found for this basename: DDIMER in the last 72 hours No results found for this basename: HGBA1C:2 in the last 72 hours No results found for this basename: CHOL:2,HDL:2,LDLCALC:2,TRIG:2,CHOLHDL:2,LDLDIRECT:2 in the last 72 hours No results found for this basename: TSH,T4TOTAL,FREET3,T3FREE,THYROIDAB in the last 72 hours No results found for this basename: VITAMINB12:2,FOLATE:2,FERRITIN:2,TIBC:2,IRON:2,RETICCTPCT:2 in the last 72 hours Imaging results:  Ct Head Wo Contrast  06/03/2012  *RADIOLOGY REPORT*  Clinical Data: New onset of weakness.  CT HEAD WITHOUT CONTRAST  Technique:  Contiguous axial images were obtained from the base of the skull through the vertex without contrast.  Comparison: 02/07/2012  Findings: The patient has diffuse cerebral  atrophy.  There is no evidence to suggest an intracranial hemorrhage, mass lesion, midline shift, hydrocephalus or large infarct. There is diffuse mucosal disease in the paranasal sinuses and there may be a small osteoid osteoma in the left frontal ethmoid region.  Cannot exclude partial ethmoidectomy.  No acute bony abnormality.  IMPRESSION: Stable head CT findings.  No acute intracranial abnormality.  Paranasal sinus disease.   Original Report Authenticated By: Richarda Overlie, M.D.    Other results:  Assessment & Plan by Problem: Dehydration Likely due to poor by mouth intake and diarrhea.  Hydrate the patient on IV fluids.  Acute on chronic diarrhea Given recent antibiotic use and given patient's change in diarrhea, sent for C. difficile PCR.  As patient is hemodynamically stable do not see the need for empiric antibiotics until C. difficile PCR comes back.  Enteric precautions.  Dementia Stable.  Hypertension Blood pressure mildly elevated, not on any antihypertensive medications.  Continue to monitor.  Venous stasis dermatitis of right anterior shin Has some erythema with central ulceration.  Does not appear to be  infected at this time.  Will request wound care consultation to help with wound management.  Failure to thrive Encourage supplemental nutrition.  Will request dietary consultation.  Osteoarthritis Stable.  Chronic kidney disease stage II/III Renal function at baseline.  Anemia, presumed to be due to B12 deficiency Send anemia panel in the morning.  Diet controlled diabetes SSI. Send hemoglobin A1C in the morning to assess glycemic control.  Not on any home diabetic medications.  Generalized weakness Will request PT/OT consultation.  Daughter also wondering about placement, request social work consultation.  Prophylaxis Lovenox.  CODE STATUS Full code.  This was addressed with patient and daughter at the time of admission.  Communication Daughter who is next of  kin but not health care power of attorney but durable power of attorney updated at bedside.  Disposition Admit the patient as observation to MedSurg.  If C. difficile comes back positive can likely be transitioned to inpatient.  Time spent on admission, talking to the patient, and coordinating care was: 60 mins.  Bevin Das A, MD 06/03/2012, 7:58 PM

## 2012-06-03 NOTE — ED Notes (Addendum)
Pt lives at home alone with the support of his daughter who comes by regularly to check on him. He has chronic diarrhea, but has recently taken a course of antibiotics and his stool is now "just liquid." Pt's daughter states he has been getting weak, but denies any fever, nausea or vomiting. Also reporting decreased appetite.

## 2012-06-03 NOTE — ED Provider Notes (Signed)
Patient has history of dementia and has been living at home since the death of his wife in 12/31/10. Daughter states patient complains of chronic diarrhea for which he takes Imodium however they have see no evidence of the diarrhea until recently when he was started on Augmentin for an infection in his leg. She reports that he has been falling and he's been very weak this week. She is found him walking around the house naked soiling on himself. He was seen by a urologist, Dr Brunilda Payor,  yesterday was told he needs a circumcision because he has phimosis and when he urinates his foreskin balloons out and he just dribbles urine. She reports she feels he needs to be in assisted living and she was going to start working on that on Monday.  PCP Dr. Marvel Plan at [facility]  Patient is elderly, he is sleeping  Medical screening examination/treatment/procedure(s) were conducted as a shared visit with non-physician practitioner(s) and myself.  I personally evaluated the patient during the encounter  Devoria Albe, MD, Franz Dell, MD 06/03/12 [phone]

## 2012-06-04 LAB — CLOSTRIDIUM DIFFICILE BY PCR: Toxigenic C. Difficile by PCR: NEGATIVE

## 2012-06-04 LAB — BASIC METABOLIC PANEL
CO2: 24 mEq/L (ref 19–32)
GFR calc non Af Amer: 57 mL/min — ABNORMAL LOW (ref >90)
Glucose, Bld: 70 mg/dL (ref 70–99)
Potassium: 4 mEq/L (ref 3.5–5.1)
Sodium: 142 mEq/L (ref 135–145)

## 2012-06-04 LAB — CBC
Hemoglobin: 10.8 g/dL — ABNORMAL LOW (ref 13.0–17.0)
MCHC: 32.5 g/dL (ref 30.0–36.0)
RBC: 3.4 MIL/uL — ABNORMAL LOW (ref 4.22–5.81)
WBC: 6.6 10*3/uL (ref 4.0–10.5)

## 2012-06-04 LAB — RETICULOCYTES: Retic Count, Absolute: 37.4 10*3/uL (ref 19.0–186.0)

## 2012-06-04 LAB — IRON AND TIBC
Iron: 34 ug/dL — ABNORMAL LOW (ref 42–135)
UIBC: 132 ug/dL (ref 125–400)

## 2012-06-04 LAB — HEMOGLOBIN A1C: Hgb A1c MFr Bld: 5.6 % (ref <5.7)

## 2012-06-04 MED ORDER — BIOTENE DRY MOUTH MT LIQD
15.0000 mL | Freq: Two times a day (BID) | OROMUCOSAL | Status: DC
  Administered 2012-06-05: 15 mL via OROMUCOSAL

## 2012-06-04 MED ORDER — CHLORHEXIDINE GLUCONATE 0.12 % MT SOLN
15.0000 mL | Freq: Two times a day (BID) | OROMUCOSAL | Status: DC
  Administered 2012-06-04 – 2012-06-06 (×4): 15 mL via OROMUCOSAL
  Filled 2012-06-04 (×8): qty 15

## 2012-06-04 MED ORDER — AMLODIPINE BESYLATE 5 MG PO TABS
5.0000 mg | ORAL_TABLET | Freq: Every day | ORAL | Status: DC
  Administered 2012-06-04 – 2012-06-06 (×3): 5 mg via ORAL
  Filled 2012-06-04 (×3): qty 1

## 2012-06-04 MED ORDER — PNEUMOCOCCAL VAC POLYVALENT 25 MCG/0.5ML IJ INJ
0.5000 mL | INJECTION | Freq: Once | INTRAMUSCULAR | Status: AC
  Administered 2012-06-04: 0.5 mL via INTRAMUSCULAR
  Filled 2012-06-04: qty 0.5

## 2012-06-04 NOTE — Progress Notes (Signed)
[facility] PROGRESS NOTE  Joshua Gardner EAV:[id number] DOB: 1922-04-04 DOA: 06/03/2012 PCP: Jeoffrey Massed, MD  Assessment/Plan: Principal Problem:  *Dehydration Active Problems:  Dementia  Osteoarthritis of both knees  Elevated blood pressure reading without diagnosis of hypertension  Venous stasis dermatitis  Acquired phimosis  Diarrhea  Failure to thrive  Anemia  CKD (chronic kidney disease), stage II    1. Acute on chronic diarrhea: Patient presented with worsening diarrhea, following a 10-day course of Augumentin therapy, raising the specter of possible C. Difficle infection. Currently on supportive treatment with iv fluids, but he has had no further diarrheal stools since hospitalization, has no abdominal pain, and is tolerating a regular diet. Stool for C. Diff PCR is pending.  2. Dehydration:  Patient was noted to have an elevated BUN/creatinine ratio, with BUN 31 and creatinine 1.26 at presentation, consistent with dehydration, secondary to GI fluid losses and poor oral intake. As described above, we are managing with iv fluids. Follow ing lytes and renal indices.  3. Dementia:   Patient appears to have profound dementia, which is stable at this time. Continued on Aricept.  4. Hypertension:   Blood pressure is mildly elevated at 160/65. Patient was not on antihypertensives, pre-admission. ot on any antihypertensive medications. Have started Norvasc. Continue to monitor.  5. Venous stasis dermatitis of right anterior shin:  Mild, has minimal erythema with central shallow ulceration. Does not appear to be infected at this time. Wound care consultation requested.  6. Failure to thrive:   Encourage supplemental nutrition. Dietary consultation has been requested.  7. Osteoarthritis:  Stable/Asymptomatic.  8. Chronic kidney disease stage II/III:  Renal function at baseline.  9. Anemia, presumed to be due to B12 deficiency:  This is mild, normocytic and  reasonable/stable. Anemia panel is pending.  10. Diet controlled diabetes:   Controlled on diet, SSI. HBA1C is pending.  11. Generalized weakness:   This is multi-factorial, secondary to deconditioning, dehydration, and age. Awaiting PT/OT consultation and recommendations. Daughter wondering about placement. Social work consultation has been requested.      Code Status: Full Code. Family Communication:  Disposition Plan: To be determined.    Brief narrative: 78 y.o. male with history of dementia, bladder cancer, osteoarthritis, chronic diarrhea on Imodium, diet controlled diabetes, hypothyroidism off medications, hypertension off antihypertensive medications, and chronic kidney disease stage III, who presented with weakness and diarrhea. Due to patient's dementia most of the history was provided by patient's daughter. She indicated that patient had chronic lower extremity venous stasis changes especially in the right lower extremity with some erythema for which he was started on Augmentin on 05/20/2012, completing a ten-day course on 05/30/2012. Over the last 2 days he had change in his diarrhea and has become more watery. His wife who recently passed away had C. difficile and daughter noted that the diarrhea that patient is having has features similar to C. difficile. She has also noted that he has had increasing weakness falls at home and loss of appetite. Patient denies any chest pain, shortness of breath, abdominal pain, headaches or vision changes. Patient was admitted for further evaluation and management.    Consultants:  N/A  Procedures:  Head CT scan.   Antibiotics:  N/A  HPI/Subjective: No vomiting or diarrhea overnight.   Objective: Vital signs in last 24 hours: Temp:  [97.5 F (36.4 C)-97.9 F (36.6 C)] 97.8 F (36.6 C) (11/17 0440) Pulse Rate:  [61-72] 72  (11/17 0440) Resp:  [14-20] 20  (11/17 0440)  BP: (149-176)/(65-120) 160/65 mmHg (11/17 0440) SpO2:  [94  %-99 %] 94 % (11/17 0440) Weight:  [63.231 kg (139 lb 6.4 oz)] 63.231 kg (139 lb 6.4 oz) (11/16 2033) Weight change:  Last BM Date: 06/03/12  Intake/Output from previous day: 11/16 0701 - 11/17 0700 In: 840 [P.O.:240; I.V.:600] Out: 275 [Urine:275]     Physical Exam: General: Comfortable, pleasantly demented, alert, communicative, fully oriented, not short of breath at rest.  HEENT:  Mild clinical pallor, no jaundice, no conjunctival injection or discharge. Hydration has improved.  NECK:  Supple, JVP not seen, no carotid bruits, no palpable lymphadenopathy, no palpable goiter. CHEST:  Clinically clear to auscultation, no wheezes, no crackles. HEART:  Sounds 1 and 2 heard, normal, regular, no murmurs. ABDOMEN:  Full, soft, non-tender, no palpable organomegaly, no palpable masses, normal bowel sounds. GENITALIA:  Not examined. LOWER EXTREMITIES:  No pitting edema, palpable peripheral pulses. Has superficial ulcer right anterior shin, and mild edema. No infected-appearing. LLE is unremarkable.  MUSCULOSKELETAL SYSTEM:  Generalized osteoarthritic changes, otherwise, normal. CENTRAL NERVOUS SYSTEM:  No focal neurologic deficit on gross examination.  Lab Results:  [facility] 06/04/12 0417 06/03/12 1729  WBC 6.6 6.3  HGB 10.8* 10.2*  HCT 33.2* 30.6*  PLT 171 148*    Basename 06/04/12 0417 06/03/12 1729  NA 142 142  K 4.0 4.3  CL 111 112  CO2 24 24  GLUCOSE 70 81  BUN 26* 31*  CREATININE 1.11 1.26  CALCIUM 8.5 8.1*   No results found for this or any previous visit (from the past 240 hour(s)).   Studies/Results: Ct Head Wo Contrast  06/03/2012  *RADIOLOGY REPORT*  Clinical Data: New onset of weakness.  CT HEAD WITHOUT CONTRAST  Technique:  Contiguous axial images were obtained from the base of the skull through the vertex without contrast.  Comparison: 02/07/2012  Findings: The patient has diffuse cerebral atrophy.  There is no evidence to suggest an intracranial hemorrhage, mass  lesion, midline shift, hydrocephalus or large infarct. There is diffuse mucosal disease in the paranasal sinuses and there may be a small osteoid osteoma in the left frontal ethmoid region.  Cannot exclude partial ethmoidectomy.  No acute bony abnormality.  IMPRESSION: Stable head CT findings.  No acute intracranial abnormality.  Paranasal sinus disease.   Original Report Authenticated By: Richarda Overlie, M.D.     Medications: Scheduled Meds:   . antiseptic oral rinse  15 mL Mouth Rinse q12n4p  . chlorhexidine  15 mL Mouth Rinse BID  . enoxaparin (LOVENOX) injection  40 mg Subcutaneous Q24H  . feeding supplement  237 mL Oral BID BM  . naproxen  500 mg Oral BID WC  . pneumococcal 23 valent vaccine  0.5 mL Intramuscular Once  . vitamin B-12  1,000 mcg Oral Daily  . [COMPLETED] sodium chloride   Intravenous Once  . [COMPLETED] sodium chloride  500 mL Intravenous Once   Continuous Infusions:   . sodium chloride 75 mL/hr at 06/03/12 2130   PRN Meds:.donepezil, ondansetron (ZOFRAN) IV, ondansetron    LOS: 1 day   Kaivon Livesey,CHRISTOPHER  [facility] Pager [phone]. If 8PM-8AM, please contact night-coverage at [email or web address], password [facility] 06/04/2012, 8:44 AM  LOS: 1 day

## 2012-06-04 NOTE — Progress Notes (Signed)
Patient did not and stool since admission.

## 2012-06-04 NOTE — ED Provider Notes (Signed)
See prior note   Ward Givens, MD 06/04/12 [phone]

## 2012-06-04 NOTE — Evaluation (Addendum)
Physical Therapy Evaluation Patient Details Name: Joshua Gardner MRN: [id number] DOB: 1922-05-05 Today's Date: 06/04/2012 Time: 4782-9562 PT Time Calculation (min): 24 min  PT Assessment / Plan / Recommendation Clinical Impression  78 yo male adamitted with  weakness and diarrhea.  He also has limited ambulation with pain due to OA in knees and is alone some of the time while granddaughter works.  Feel he would benefit from short term SNF, available to increase function and decrease burden of care.  If not, recommend HHPT and 24/7 assist    PT Assessment  Patient needs continued PT services    Follow Up Recommendations  Home health PT;SNF    Does the patient have the potential to tolerate intense rehabilitation      Barriers to Discharge Decreased caregiver support      Equipment Recommendations  None recommended by PT    Recommendations for Other Services OT consult   Frequency Min 3X/week    Precautions / Restrictions Precautions Precautions: Fall   Pertinent Vitals/Pain C/o pain in knees      Mobility  Bed Mobility Bed Mobility: Rolling Right;Supine to Sit;Sit to Supine Rolling Right: 4: Min assist Supine to Sit: 4: Min assist Sit to Supine: 3: Mod assist Details for Bed Mobility Assistance: excessive time  required.  Pt needs assist to scoot around to sitting on EOB Transfers Transfers: Stand to Sit Stand to Sit: 3: Mod assist Details for Transfer Assistance: Needs assist to lift hips off surface Ambulation/Gait Ambulation/Gait Assistance: 3: Mod assist Ambulation Distance (Feet): 10 Feet Assistive device: Rolling walker Ambulation/Gait Assistance Details: pt maintains forward head/kyphosis and c/o knee pain while ambulating Gait Pattern: Step-to pattern;Antalgic;Trunk flexed Gait velocity: very slow General Gait Details: diffuclty with RW due to pain in legs, but able to progress short distances Stairs: No Wheelchair Mobility Wheelchair Mobility: No      Shoulder Instructions     Exercises     PT Diagnosis: Difficulty walking;Abnormality of gait;Generalized weakness;Acute pain  PT Problem List: Pain;Decreased mobility;Decreased activity tolerance;Decreased strength PT Treatment Interventions: DME instruction;Gait training;Functional mobility training;Therapeutic activities;Therapeutic exercise   PT Goals Acute Rehab PT Goals PT Goal Formulation: Patient unable to participate in goal setting Time For Goal Achievement: 06/11/12 Potential to Achieve Goals: Good Pt will go Supine/Side to Sit: with supervision PT Goal: Supine/Side to Sit - Progress: Goal set today Pt will go Sit to Supine/Side: with supervision PT Goal: Sit to Supine/Side - Progress: Goal set today Pt will go Stand to Sit: with supervision PT Goal: Stand to Sit - Progress: Goal set today Pt will Ambulate: >150 feet;with supervision;with least restrictive assistive device PT Goal: Ambulate - Progress: Goal set today  Visit Information  Last PT Received On: 06/04/12 Assistance Needed: +2 (safety)    Subjective Data  Subjective: "I don't walk much" Patient Stated Goal: I need to pee   Prior Functioning  Home Living Lives With: Alone;Other (Comment) (granddaughter stays with him, but works) Available Help at Discharge: Family Type of Home: House Home Adaptive Equipment: Wheelchair - manual Prior Function Comments: difficult to get full information from patient Communication Communication: No difficulties    Cognition  Overall Cognitive Status: No family/caregiver present to determine baseline cognitive functioning Arousal/Alertness: Awake/alert Behavior During Session: [facility] for tasks performed Cognition - Other Comments: pt with some latency of responses    Extremity/Trunk Assessment Right Lower Extremity Assessment RLE ROM/Strength/Tone: [facility] for tasks assessed;Deficits RLE ROM/Strength/Tone Deficits: genu varus evident in standing RLE Sensation: WFL - Light  Touch;WFL - Proprioception Left Lower Extremity Assessment LLE ROM/Strength/Tone: WFL for tasks assessed LLE Sensation: WFL - Light Touch;WFL - Proprioception Trunk Assessment Trunk Assessment: Kyphotic (forward head)   Balance Balance Balance Assessed: Yes Static Sitting Balance Static Sitting - Balance Support: No upper extremity supported Static Sitting - Level of Assistance: 7: Independent Static Sitting - Comment/# of Minutes: 3  End of Session PT - End of Session Activity Tolerance: Patient limited by pain Patient left: in bed;with bed alarm set Nurse Communication: Mobility status  GP Functional Assessment Tool Used: clinical judgement Functional Limitation: Mobility: Walking and moving around Mobility: Walking and Moving Around Current Status (W0981): At least 60 percent but less than 80 percent impaired, limited or restricted Mobility: Walking and Moving Around Goal Status [phone]): At least 1 percent but less than 20 percent impaired, limited or restricted   Rosey Bath K. Rolfe, [place] [phone] 06/04/2012, 12:01 PM

## 2012-06-04 NOTE — Progress Notes (Signed)
CSW received referral. Assessment to follow.   Avanna Sowder, LCSWA [place] [facility] Coverage [phone]   

## 2012-06-05 DIAGNOSIS — N471 Phimosis: Secondary | ICD-10-CM

## 2012-06-05 LAB — BASIC METABOLIC PANEL
BUN: 23 mg/dL (ref 6–23)
CO2: 25 mEq/L (ref 19–32)
Calcium: 8.8 mg/dL (ref 8.4–10.5)
Chloride: 106 mEq/L (ref 96–112)
Creatinine, Ser: 1.07 mg/dL (ref 0.50–1.35)
GFR calc Af Amer: 69 mL/min — ABNORMAL LOW (ref >90)
GFR calc non Af Amer: 60 mL/min — ABNORMAL LOW (ref >90)
Glucose, Bld: 102 mg/dL — ABNORMAL HIGH (ref 70–99)
Potassium: 4.6 mEq/L (ref 3.5–5.1)
Sodium: 138 mEq/L (ref 135–145)

## 2012-06-05 LAB — CBC
Platelets: 180 10*3/uL (ref 150–400)
RDW: 13.2 % (ref 11.5–15.5)
WBC: 14.6 10*3/uL — ABNORMAL HIGH (ref 4.0–10.5)

## 2012-06-05 MED ORDER — DONEPEZIL HCL 5 MG PO TABS
5.0000 mg | ORAL_TABLET | Freq: Every day | ORAL | Status: DC
  Filled 2012-06-05 (×2): qty 1

## 2012-06-05 MED ORDER — ACETAMINOPHEN 325 MG PO TABS: 650.0000 mg | ORAL_TABLET | ORAL | Status: DC | PRN

## 2012-06-05 MED ORDER — HYDROCODONE-ACETAMINOPHEN 5-325 MG PO TABS
1.0000 | ORAL_TABLET | ORAL | Status: DC | PRN
  Administered 2012-06-05: 1 via ORAL
  Filled 2012-06-05: qty 1

## 2012-06-05 MED ORDER — TUBERCULIN PPD 5 UNIT/0.1ML ID SOLN
5.0000 [IU] | Freq: Once | INTRADERMAL | Status: AC
  Administered 2012-06-05: 5 [IU] via INTRADERMAL
  Filled 2012-06-05: qty 0.1

## 2012-06-05 NOTE — Progress Notes (Signed)
INITIAL ADULT NUTRITION ASSESSMENT Date: 06/05/2012   Time: 11:00 AM Reason for Assessment: Consult  INTERVENTION: Continue Ensure Complete BID. Nursing to continue to encourage increased meal intake. Will monitor.   ASSESSMENT: Male 78 y.o.  Dx: Dehydration  Food/Nutrition Related Hx: Pt lives alone, checked on by daughter, admitted with liquid stool and decreased appetite. Pt with dementia and no family present. Per H&P, daughter reported recent decreased appetite. No changes in weight in the past month. Nursing reports pt ate minimal breakfast yesterday and no lunch, however report pt has been doing well with drinking liquids. Met with pt while he was eating breakfast this morning and he stated he was liking his food. Meal intake improved to 50% completion. MD notes pt without further diarrheal stools since admission.   Hx:  Past Medical History  Diagnosis Date  . History of bladder cancer   . Dementia     With hallucinations/paranoia.  July 2013 CT brain: atrophy + small vessel ischemic demyelination  . Osteoarthritis of both knees   . Urinary incontinence   . History of non anemic vitamin B12 deficiency 08/2011 (level was 171)    Vit B12 level normal (466) 11/2011 at previous PCP.  Marland Kitchen DM2 (diabetes mellitus, type 2)     Diet controlled (per old records with [facility]).  . History of hypothyroidism     currently off meds  . History of hypertension     Normotensive off meds as of 11/2011  . Chronic renal insufficiency, stage III (moderate)     Borderline stage II/III (Est CrCl about 60).   Related Meds:  Scheduled Meds:   . amLODipine  5 mg Oral Daily  . antiseptic oral rinse  15 mL Mouth Rinse q12n4p  . chlorhexidine  15 mL Mouth Rinse BID  . enoxaparin (LOVENOX) injection  40 mg Subcutaneous Q24H  . feeding supplement  237 mL Oral BID BM  . naproxen  500 mg Oral BID WC  . [COMPLETED] pneumococcal 23 valent vaccine  0.5 mL Intramuscular Once  . vitamin B-12  1,000 mcg Oral  Daily   Continuous Infusions:   . [EXPIRED] sodium chloride 75 mL/hr at 06/04/12 1344   PRN Meds:.donepezil, ondansetron (ZOFRAN) IV, ondansetron  Ht: 5' (152.4 cm)  Wt: 139 lb 6.4 oz (63.231 kg)  Ideal Wt: 105 lb % Ideal Wt: 132  Usual Wt: 139 lb earlier in the month for office visit on 05/19/12 % Usual Wt: 100   Body mass index is 27.22 kg/(m^2).   Labs:  CMP     Component Value Date/Time   NA 138 06/05/2012 0408   K 4.6 06/05/2012 0408   CL 106 06/05/2012 0408   CO2 25 06/05/2012 0408   GLUCOSE 102* 06/05/2012 0408   BUN 23 06/05/2012 0408   CREATININE 1.07 06/05/2012 0408   CALCIUM 8.8 06/05/2012 0408   PROT 6.1 05/09/2012 1116   ALBUMIN 3.2* 05/09/2012 1116   AST 29 05/09/2012 1116   ALT 18 05/09/2012 1116   ALKPHOS 57 05/09/2012 1116   BILITOT 0.6 05/09/2012 1116   GFRNONAA 60* 06/05/2012 0408   GFRAA 69* 06/05/2012 0408   Lab Results  Component Value Date   HGBA1C 5.6 06/04/2012   CBG (last 3)  No results found for this basename: GLUCAP:3 in the last 72 hours    Intake/Output Summary (Last 24 hours) at 06/05/12 1107 Last data filed at 06/05/12 0455  Gross per 24 hour  Intake    380 ml  Output  600 ml  Net   -220 ml   Last BM - 11/17  Diet Order: Cardiac   IVF:    [EXPIRED] sodium chloride Last Rate: 75 mL/hr at 06/04/12 1344    Estimated Nutritional Needs:   Kcal:1400-1500 Protein:40-50g Fluid:1.4-1.5L  NUTRITION DIAGNOSIS: -Inadequate oral intake (NI-2.1).  Status: Ongoing  RELATED TO: decreased appetite with dementia  AS EVIDENCE BY: H&P  MONITORING/EVALUATION(Goals): Pt to consume >90% of meals/supplements.   EDUCATION NEEDS: -No education needs identified at this time   Dietitian #: [phone]  DOCUMENTATION CODES Per approved criteria  -Not Applicable    Marshall Cork 06/05/2012, 11:00 AM

## 2012-06-05 NOTE — Progress Notes (Addendum)
Clinical Social Work Department CLINICAL SOCIAL WORK PLACEMENT NOTE 06/05/2012  Patient:  Joshua Gardner, Joshua Gardner  Account Number:  [id number] Admit date:  06/03/2012  Clinical Social Worker:  Jacelyn Grip  Date/time:  06/05/2012 12:00 N  Clinical Social Work is seeking post-discharge placement for this patient at the following level of care:   SKILLED NURSING   (*CSW will update this form in Epic as items are completed)   06/05/2012  Patient/family provided with [facility] Department of Clinical Social Work's list of facilities offering this level of care within the geographic area requested by the patient (or if unable, by the patient's family).  06/05/2012  Patient/family informed of their freedom to choose among providers that offer the needed level of care, that participate in Medicare, Medicaid or managed care program needed by the patient, have an available bed and are willing to accept the patient.  06/05/2012  Patient/family informed of [facility]' ownership interest in [facility], as well as of the fact that they are under no obligation to receive care at this facility.  PASARR submitted to EDS on 06/06/2012 PASARR number received from EDS on 06/06/2012  FL2 transmitted to all facilities in geographic area requested by pt/family on  06/05/2012 FL2 transmitted to all facilities within larger geographic area on   Patient informed that his/her managed care company has contracts with or will negotiate with  certain facilities, including the following:     Patient/family informed of bed offers received: 06/06/2012  Patient chooses bed at [facility] Physician recommends and patient chooses bed at    Patient to be transferred to  on  [facility] on 06/06/2012 Patient to be transferred to facility by pt daughter via private vehicle  The following physician request were entered in Epic:   Additional Comments: [facility] search initiated and  referral made to [facility] as pt daughter was exploring placement at [facility] prior to pt admission to hospital   Jacklynn Lewis, MSW, [facility]  Clinical Social Work [phone]

## 2012-06-05 NOTE — Clinical Social Work Psychosocial (Signed)
     Clinical Social Work Department BRIEF PSYCHOSOCIAL ASSESSMENT 06/05/2012  Patient:  Joshua Gardner, Joshua Gardner     Account Number:  [id number]     Admit date:  06/03/2012  Clinical Social Worker:  Jacelyn Grip  Date/Time:  06/05/2012 11:30 AM  Referred by:  Physician  Date Referred:  06/05/2012 Referred for  SNF Placement   Other Referral:   Interview type:  Family Other interview type:    PSYCHOSOCIAL DATA Living Status:  ALONE Admitted from facility:   Level of care:   Primary support name:  Dois Davenport Parkes/daughter/[phone] Primary support relationship to patient:  CHILD, ADULT Degree of support available:   adequate    CURRENT CONCERNS Current Concerns  Post-Acute Placement   Other Concerns:    SOCIAL WORK ASSESSMENT / PLAN CSW received referral for New SNF placement. CSW reviewed chart and PT recommending Home with HH with 24 hour care vs short term snf placement.    CSW visited pt room, but pt with dementia and no family present at bedside. CSW contacted pt daughter, Dois Davenport to discuss. Pt daughter reports that pt lives alone beside pt daughter, but does not have 24 hour care. Pt daughter reports that she has been in discussion with [facility] about placement and interested in pursuing that. CSW discussed the option of short term rehab as well and pt daughter agreeable to SNF search as secondary option if [facility] unable to accept pt from hospital.    CSW completed FL2 and contacted [facility]. [facility] plans to have RN come assess pt today and CSW faxed pt clinicals to facility and will await response. CSW initiated SNF search to [facility] as other option for [facility]. CSW to follow up with pt daughter in regard to response from [facility] and bed offers from SNF. CSW to continue to follow and facilitate pt discharge needs when pt medically stable for discharge.   Assessment/plan status:  Psychosocial Support/Ongoing  Assessment of Needs Other assessment/ plan:   discharge planning   Information/referral to community resources:   [facility] list e-mailed to pt daughter; referral to [facility]    PATIENTS/FAMILYS RESPONSE TO PLAN OF CARE: Per chart, pt with dementia and oriented to person only. Pt daughter appears supportive and actively involved in pt care. Pt daughter is hopeful that [facility] ALF is able to meet pt needs as pt daughter was already seeking placement at that facility.

## 2012-06-05 NOTE — Consult Note (Signed)
WOC consult Note Reason for Consult:traumatic injury on right pretibial area Wound type:traumatic Pressure Ulcer POA: No Measurement:1cm x 1.5cm (depth is not determined due to the presence of a recently placed dressing.  Suspect (and by appearance it is approximately .2cm) Wound ZOX:WRUEA, pink with yellow dressing by product covering wound. Drainage (amount, consistency, odor) yellow fluid beneath dressing is dressing byproduct. Periwound:intact Dressing procedure/placement/frequency:thin film dressing with once weekly changes are appropriate. I will not follow.  Please re-consult if needed. Thanks, Ladona Mow, MSN, RN, [facility], CWOCN [phone])

## 2012-06-05 NOTE — Evaluation (Addendum)
Occupational Therapy Evaluation Patient Details Name: Joshua Gardner MRN: [id number] DOB: [birth year] Today's Date: 06/05/2012 Time: 4098-1191 OT Time Calculation (min): 12 min  OT Assessment / Plan / Recommendation Clinical Impression  Joshua Gardner is a 78 y.o. Caucasian male with history of dementia, history of bladder cancer, osteoarthritis, chronic diarrhea on Imodium, diet controlled diabetes, hypothyroidism off medications, hypertension off antihypertensive medications, and chronic kidney disease stage III who presents with weakness and diarrhea. He displays decreased strength and functional mobility. no family present to confirm baseline function with ADL. Will need 24/7 at discharge and will need SNF if 24/7 not available.     OT Assessment  Patient needs continued OT Services    Follow Up Recommendations  SNF--unless family able to provide 24/7 assist.     Barriers to Discharge      Equipment Recommendations  None recommended by PT;3 in 1 bedside comode;Other (comment) (need to confirm DME with family)    Recommendations for Other Services    Frequency  Min 2X/week    Precautions / Restrictions Precautions Precautions: Fall Restrictions Weight Bearing Restrictions: No        ADL  Eating/Feeding: Other (comment) (not tested) Grooming: Wash/dry face;Supervision/safety;Set up Where Assessed - Grooming: Supported sitting Upper Body Bathing: Simulated;Chest;Right arm;Left arm;Abdomen;Minimal assistance Where Assessed - Upper Body Bathing: Unsupported sitting Lower Body Bathing: Simulated;Moderate assistance Where Assessed - Lower Body Bathing: Supported sit to stand Upper Body Dressing: Simulated;Minimal assistance Where Assessed - Upper Body Dressing: Unsupported sitting Lower Body Dressing: Simulated;Moderate assistance;Other (comment) (able to doff sock but unable to start L sock over foot) Where Assessed - Lower Body Dressing: Supported sit to stand Toilet  Transfer: Mining engineer Method: Sit to stand Toileting - Architect and Hygiene: Simulated;Minimal assistance Where Assessed - Engineer, mining and Hygiene: Sit to stand from 3-in-1 or toilet Tub/Shower Transfer Method: Not assessed Equipment Used: Rolling walker ADL Comments: Performed only sit to stand and some dynamic balance activities in standing to simulate clothing management.     OT Diagnosis: Generalized weakness  OT Problem List: Decreased strength;Decreased activity tolerance;Decreased knowledge of use of DME or AE OT Treatment Interventions: Self-care/ADL training;Therapeutic activities;DME and/or AE instruction;Patient/family education   OT Goals Acute Rehab OT Goals OT Goal Formulation: With patient Time For Goal Achievement: 06/19/12 Potential to Achieve Goals: Good ADL Goals Pt Will Perform Grooming: with min assist;Standing at sink ADL Goal: Grooming - Progress: Goal set today Pt Will Perform Lower Body Bathing: with min assist;Sit to stand from chair;Sit to stand from bed ADL Goal: Lower Body Bathing - Progress: Goal set today Pt Will Perform Lower Body Dressing: with min assist;Sit to stand from chair;Sit to stand from bed ADL Goal: Lower Body Dressing - Progress: Goal set today Pt Will Transfer to Toilet: with min assist;Ambulation;with DME;3-in-1 ADL Goal: Toilet Transfer - Progress: Goal set today Pt Will Perform Toileting - Clothing Manipulation: with min assist;Other (comment);Standing (min guard) ADL Goal: Toileting - Clothing Manipulation - Progress: Goal set today  Visit Information  Last OT Received On: 06/05/12 Assistance Needed: +2 (for safety)    Subjective Data  Subjective: pt sleepy upon arrival. doesnt say much, mostly yes/no Patient Stated Goal: none stated   Prior Functioning     Home Living Lives With: Alone;Other (Comment) (granddaughter stays with him, but works) Available Help  at Discharge: Family Type of Home: House Home Adaptive Equipment: Wheelchair - manual Prior Function Comments: unable to determine exact PLOF with ADL. pt  not able to answer all questions. He states he generally does his own ADL. No family present to confirm DME and how he does ADL. Communication Communication: No difficulties         Vision/Perception     Cognition  Overall Cognitive Status: History of cognitive impairments - at baseline Arousal/Alertness: Lethargic (pt able to wake up some during session) Behavior During Session: [facility] for tasks performed    Extremity/Trunk Assessment Right Upper Extremity Assessment RUE ROM/Strength/Tone: [facility] for tasks assessed Left Upper Extremity Assessment LUE ROM/Strength/Tone: WFL for tasks assessed     Mobility Transfers Transfers: Sit to Stand;Stand to Sit Sit to Stand: 4: Min assist;With upper extremity assist;From chair/3-in-1 Stand to Sit: 4: Min assist;With upper extremity assist;To chair/3-in-1 Details for Transfer Assistance: verbal cues for hand placement/technique     Shoulder Instructions     Exercise     Balance Balance Balance Assessed: Yes Dynamic Standing Balance Dynamic Standing - Level of Assistance: 4: Min assist;Other (comment) (one UE on RW at all times)   End of Session OT - End of Session Activity Tolerance: Patient tolerated treatment well Patient left: in chair;with chair alarm set;with call bell/phone within reach  GO Functional Assessment Tool Used: clinical judgement Functional Limitation: Self care Self Care Current Status (Z6109): At least 40 percent but less than 60 percent impaired, limited or restricted Self Care Goal Status (U0454): At least 1 percent but less than 20 percent impaired, limited or restricted   Lennox Laity [phone] 06/05/2012, 12:41 PM

## 2012-06-05 NOTE — Progress Notes (Signed)
Physical Therapy Treatment Patient Details Name: Joshua Gardner MRN: [id number] DOB: Sep 28, 1921 Today's Date: 06/05/2012 Time: 8295-6213 PT Time Calculation (min): 29 min  PT Assessment / Plan / Recommendation Comments on Treatment Session  Pt incontinent of strong odored urine during ambulation, but was able to walk further with RW today    Follow Up Recommendations  SNF;Home health PT (depends on available care at d/c)     Does the patient have the potential to tolerate intense rehabilitation     Barriers to Discharge        Equipment Recommendations  None recommended by PT    Recommendations for Other Services    Frequency     Plan Discharge plan remains appropriate;Frequency remains appropriate    Precautions / Restrictions Precautions Precautions: Fall Restrictions Weight Bearing Restrictions: No   Pertinent Vitals/Pain Pt c/o aching in knees after walking    Mobility  Bed Mobility Bed Mobility: Rolling Right;Supine to Sit;Sit to Supine Rolling Right: 4: Min assist Supine to Sit: 4: Min assist Sit to Supine: 3: Mod assist Details for Bed Mobility Assistance: excessive time  required.  Pt needs assist to scoot around to sitting on EOB Transfers Transfers: Stand to Sit Sit to Stand: 4: Min assist Stand to Sit: 4: Min assist Details for Transfer Assistance: Needs assist to lift hips off surface Ambulation/Gait Ambulation/Gait Assistance: 4: Min assist Ambulation Distance (Feet): 125 Feet (25'-pt incontinent of urine- then 100') Assistive device: Rolling walker Ambulation/Gait Assistance Details: asssitance for safety and to encourage ambulation distance Gait Pattern: Antalgic;Trunk flexed;Step-through pattern Gait velocity: decreased General Gait Details: better with RW today. Pt incontinent during ambulation(urine with strong odor) and denied urgency when asked about it before walking. He was able to progress well, but did report knee pain after  walking Stairs: No    Exercises     PT Diagnosis:    PT Problem List:   PT Treatment Interventions:     PT Goals Acute Rehab PT Goals PT Goal Formulation: Patient unable to participate in goal setting Time For Goal Achievement: 06/11/12 Potential to Achieve Goals: Good Pt will go Supine/Side to Sit: with supervision PT Goal: Supine/Side to Sit - Progress: Progressing toward goal Pt will go Sit to Supine/Side: with supervision PT Goal: Sit to Supine/Side - Progress: Progressing toward goal Pt will go Stand to Sit: with supervision PT Goal: Stand to Sit - Progress: Progressing toward goal Pt will Ambulate: >150 feet;with supervision;with least restrictive assistive device PT Goal: Ambulate - Progress: Progressing toward goal  Visit Information  Last PT Received On: 06/05/12 Assistance Needed: +2 (for safety)    Subjective Data  Subjective: little verbalization today Patient Stated Goal: none stated   Cognition  Overall Cognitive Status: No family/caregiver present to determine baseline cognitive functioning Arousal/Alertness: Awake/alert Behavior During Session: [facility] for tasks performed Cognition - Other Comments: motor and verbal response latency    Balance  Balance Balance Assessed: Yes Static Sitting Balance Static Sitting - Balance Support: No upper extremity supported Static Sitting - Level of Assistance: 7: Independent Static Sitting - Comment/# of Minutes: ~5 Static Standing Balance Static Standing - Balance Support: Bilateral upper extremity supported;During functional activity Static Standing - Level of Assistance: 5: Stand by assistance Static Standing - Comment/# of Minutes:  ~10 pt able to maintain balance while having legs washed and dried (able to maintain balance during this pertebation) Dynamic Standing Balance Dynamic Standing - Level of Assistance: 4: Min assist;Other (comment) (one UE on RW at all  times)  End of Session PT - End of Session Equipment  Utilized During Treatment: Gait belt Activity Tolerance: Patient tolerated treatment well Patient left: in chair;with chair alarm set Nurse Communication: Mobility status   GP     Bayard Hugger. Manson Passey, PT [phone] 06/05/2012, 1:25 PM

## 2012-06-05 NOTE — Progress Notes (Signed)
Patient ID: Joshua Gardner, male   DOB: Sep 05, 1921, 78 y.o.   MRN: [id number]  [facility] PROGRESS NOTE  [name]:[id number] DOB: [birth year] DOA: 06/03/2012 PCP: Jeoffrey Massed, MD  Brief narrative: 78 y.o. male with history of dementia, bladder cancer, osteoarthritis, chronic diarrhea on Imodium, diet controlled diabetes, hypothyroidism off medications, hypertension off antihypertensive medications, and chronic kidney disease stage III, who presented with weakness and diarrhea. Due to patient's dementia most of the history was provided by patient's daughter. She indicated that patient had chronic lower extremity venous stasis changes especially in the right lower extremity with some erythema for which he was started on Augmentin on 05/20/2012, completing a ten-day course on 05/30/2012. Over the last 2 days prior to admission he had change in his diarrhea and has become more watery. His wife who recently passed away had C. difficile and daughter noted that the diarrhea that patient is having has features similar to C. difficile. She has also noted that he has had increasing weakness falls at home and loss of appetite. Patient denied any chest pain, shortness of breath, abdominal pain, headaches or vision changes. Patient was admitted for further evaluation and management.   Assessment and Plan: 1. Acute on chronic diarrhea:   Patient presented with worsening diarrhea, following a 10-day course of Augumentin therapy, raising the specter of possible C. Difficle infection.   Currently on supportive treatment with iv fluids, but he has had no further diarrheal stools since hospitalization, has no abdominal pain, and is tolerating a regular diet.   Stool for C. Diff PCR is negative. 2. Dehydration:   Patient was noted to have an elevated BUN/creatinine ratio, with BUN 31 and creatinine 1.26 at presentation, consistent with dehydration, secondary to GI fluid losses and poor oral  intake.   Now resolved. 3. Dementia:   Patient appears to have profound dementia, which is stable at this time.   Continued on Aricept.  4. Hypertension:   Blood pressure is mildly elevated  Continue Norvasc  5. Venous stasis dermatitis of right anterior shin:   Mild, has minimal erythema with central shallow ulceration.  Does not appear to be infected at this time.   Appreciate wound care consultation  6. Failure to thrive:   Encourage supplemental nutrition. Dietary consultation has been requested.  7. Osteoarthritis:   Stable/Asymptomatic.  8. Chronic kidney disease stage II/III:   Renal function at baseline.  9. Anemia, presumed to be due to B12 deficiency:   This is mild, normocytic and reasonable/stable. Anemia panel is pending.  10. Diet controlled diabetes:   Controlled on diet, SSI 11. Generalized weakness:   This is multi-factorial, secondary to deconditioning, dehydration, and age.   SNF recommended.  Consultants:  PT evaluation  Wound care  Procedures/Studies:  CT Head 11/16 --> no acute intracranial abnormalities   Antibiotics:  None  Code Status: Full Family Communication: Pt at bedside Disposition Plan: SNF  HPI/Subjective: No events overnight.   Objective: Filed Vitals:   06/04/12 1430 06/04/12 2032 06/05/12 0455 06/05/12 1355  BP: 147/102 133/66 150/73 147/79  Pulse: 68 75 72 68  Temp: 97.2 F (36.2 C) 97.4 F (36.3 C) 98.4 F (36.9 C) 97.3 F (36.3 C)  TempSrc: Oral Oral Oral Oral  Resp: 18 19 20 16   Height:      Weight:      SpO2: 97% 98% 95% 100%    Intake/Output Summary (Last 24 hours) at 06/05/12 1638 Last data filed at 06/05/12 [phone]  Gross per 24 hour  Intake    380 ml  Output    300 ml  Net     80 ml   Exam:   General:  Pt is alert, follows commands appropriately, not in acute distress  Cardiovascular: Regular rate and rhythm, S1/S2, no murmurs, no rubs, no gallops  Respiratory: Clear to auscultation  bilaterally, decreased breath sounds at bases   Abdomen: Soft, non tender, non distended, bowel sounds present, no guarding  Extremities: No edema, pulses DP and PT palpable bilaterally  Neuro: Grossly non focal, pt oriented to name only  Data Reviewed: Basic Metabolic Panel:  Lab 06/05/12 1191 06/04/12 0417 06/03/12 1729  NA 138 142 142  K 4.6 4.0 4.3  CL 106 111 112  CO2 25 24 24   GLUCOSE 102* 70 81  BUN 23 26* 31*  CREATININE 1.07 1.11 1.26  CALCIUM 8.8 8.5 8.1*  MG -- -- --  PHOS -- -- --   CBC:  Lab 06/05/12 0408 06/04/12 0417 06/03/12 1729  WBC 14.6* 6.6 6.3  NEUTROABS -- -- 4.3  HGB 11.4* 10.8* 10.2*  HCT 34.3* 33.2* 30.6*  MCV 96.1 97.6 96.8  PLT 180 171 148*   Recent Results (from the past 240 hour(s))  CLOSTRIDIUM DIFFICILE BY PCR     Status: Normal   Collection Time   06/04/12  4:22 PM      Component Value Range Status Comment   C difficile by pcr NEGATIVE  NEGATIVE Final      Scheduled Meds:   . amLODipine  5 mg Oral Daily  . donepezil  5 mg Oral QHS  . enoxaparin  injection  40 mg Subcutaneous Q24H  . naproxen  500 mg Oral BID WC  . vitamin B-12  1,000 mcg Oral Daily   Continuous Infusions:  Debbora Presto, MD  [facility] Pager [phone]  If 7PM-7AM, please contact night-coverage [email or web address] Password [email or web address] 06/05/2012, 4:38 PM   LOS: 2 days

## 2012-06-06 ENCOUNTER — Telehealth: Payer: Self-pay | Admitting: *Deleted

## 2012-06-06 ENCOUNTER — Encounter: Payer: Self-pay | Admitting: Family Medicine

## 2012-06-06 LAB — BASIC METABOLIC PANEL
BUN: 26 mg/dL — ABNORMAL HIGH (ref 6–23)
Chloride: 108 mEq/L (ref 96–112)
Creatinine, Ser: 1.23 mg/dL (ref 0.50–1.35)
GFR calc Af Amer: 59 mL/min — ABNORMAL LOW (ref >90)
Glucose, Bld: 91 mg/dL (ref 70–99)

## 2012-06-06 LAB — CBC
HCT: 34 % — ABNORMAL LOW (ref 39.0–52.0)
Hemoglobin: 11.2 g/dL — ABNORMAL LOW (ref 13.0–17.0)
MCHC: 32.9 g/dL (ref 30.0–36.0)
MCV: 96.3 fL (ref 78.0–100.0)
RDW: 13.4 % (ref 11.5–15.5)
WBC: 12.5 10*3/uL — ABNORMAL HIGH (ref 4.0–10.5)

## 2012-06-06 LAB — FOLATE: Folate: 5 ng/mL — ABNORMAL LOW (ref >5.4)

## 2012-06-06 MED ORDER — DONEPEZIL HCL 5 MG PO TABS: 5.0000 mg | ORAL_TABLET | Freq: Every day | ORAL | Status: DC

## 2012-06-06 MED ORDER — HYDROCODONE-ACETAMINOPHEN 5-325 MG PO TABS
1.0000 | ORAL_TABLET | ORAL | Status: DC | PRN
Start: 2012-06-06 — End: 2012-06-06

## 2012-06-06 MED ORDER — AMLODIPINE BESYLATE 5 MG PO TABS: 5.0000 mg | ORAL_TABLET | Freq: Every day | ORAL | Status: DC

## 2012-06-06 MED ORDER — HYDROCODONE-ACETAMINOPHEN 5-325 MG PO TABS: 1.0000 | ORAL_TABLET | ORAL | Status: DC | PRN

## 2012-06-06 NOTE — Telephone Encounter (Signed)
Pls notify daughter that the diabetes dx was carried over from his old records at [facility]--they mentioned that he had "diet controlled diabetes" several times, but the only HbA1c they had in records was 5.6%, which is normal.  This is the same HbA1c we got for him recently.  So, I'll take diabetes off his list.   However, he does have impaired kidney function of the CHRONIC variety.  His kidneys are only functioning about 50% of normal and this has been stable with blood checks recently.  This dx will have to stay b/c it is a valid/truthful dx and it needs to be listed so it can be monitored appropriately and clinicians can take this into account when rx'ing meds and when evaluating him for complaints/illnesses, etc.   So, I'll take DM dx out of EMR---is this all I need to do, or is there some written document needed or paper to be signed??--let me know.-thx

## 2012-06-06 NOTE — Progress Notes (Addendum)
Clinical Social Worker spoke with pt daughter. Pt daughter has decided on [facility] for pt discharge plan. Clinical Social Worker spoke with facility who confirmed that they could accept pt and requested [facility] PT/OT orders as they have their own therapy at [facility].  Clinical Social Worker working on faxing discharge information to facility and pt daughter plans to transport via private vehicle. Clinical Social Worker to continue to follow.  Jacklynn Lewis, MSW, LCSWA  Clinical Social Work [phone]

## 2012-06-06 NOTE — Progress Notes (Signed)
Occupational Therapy Treatment Patient Details Name: Joshua Gardner MRN: [id number] DOB: 01-Mar-1922 Today's Date: 06/06/2012 Time: 4098-1191 OT Time Calculation (min): 30 min  OT Assessment / Plan / Recommendation Comments on Treatment Session      Follow Up Recommendations  SNF    Barriers to Discharge       Equipment Recommendations       Recommendations for Other Services    Frequency Min 2X/week   Plan      Precautions / Restrictions Precautions Precautions: Fall Restrictions Weight Bearing Restrictions: No   Pertinent Vitals/Pain No c/o pain; no signs symptoms    ADL  Upper Body Bathing: Performed;Supervision/safety (min cues for completeness) Where Assessed - Upper Body Bathing: Unsupported sitting Lower Body Bathing: Performed;Minimal assistance Where Assessed - Lower Body Bathing: Supported sit to stand Lower Body Dressing: Performed;Moderate assistance (urinary incontinence brief and socks) Where Assessed - Lower Body Dressing: Supported sit to stand Toilet Transfer: Simulated;Minimal assistance Toilet Transfer Method: Stand pivot (bed to chair) Equipment Used: Rolling walker Transfers/Ambulation Related to ADLs: spt to recliner only ADL Comments: pt was incontinent of urine; incontinence brief on. Pt thorough with washing but needed cues to not perseverate especially with upper body    OT Diagnosis:    OT Problem List:   OT Treatment Interventions:     OT Goals ADL Goals Pt Will Perform Lower Body Bathing: with min assist;Sit to stand from chair;Sit to stand from bed ADL Goal: Lower Body Bathing - Progress: Met Pt Will Perform Lower Body Dressing: with min assist;Sit to stand from chair;Sit to stand from bed ADL Goal: Lower Body Dressing - Progress: Progressing toward goals Pt Will Transfer to Toilet: with min assist;Ambulation;with DME;3-in-1 ADL Goal: Toilet Transfer - Progress: Progressing toward goals Miscellaneous OT Goals Miscellaneous OT Goal  #1: pt will complete upper body bathing with supervision and no more than 2 vcs for repeated body parts/ending activity OT Goal: Miscellaneous Goal #1 - Progress: Goal set today  Visit Information  Last OT Received On: 06/06/12 Assistance Needed: +1    Subjective Data      Prior Functioning       Cognition  Overall Cognitive Status: History of cognitive impairments - at baseline Arousal/Alertness: Awake/alert Behavior During Session: Joshua Gardner for tasks performed Cognition - Other Comments: perseverated a little; alarm for safety    Mobility  Shoulder Instructions Transfers Sit to Stand: 4: Min assist Details for Transfer Assistance: min cues       Exercises      Balance Static Sitting Balance Static Sitting - Balance Support: No upper extremity supported Static Sitting - Level of Assistance:  (supervision for safety) Static Sitting - Comment/# of Minutes: 5 Static Standing Balance Static Standing - Balance Support: Bilateral upper extremity supported Static Standing - Level of Assistance: 5: Stand by assistance Static Standing - Comment/# of Minutes: 5   End of Session OT - End of Session Activity Tolerance: Patient tolerated treatment well Patient left: in chair;with chair alarm set;with call bell/phone within reach  GO     Joshua Gardner 06/06/2012, 10:09 AM Marica Otter, OTR/L [phone] 06/06/2012

## 2012-06-06 NOTE — Progress Notes (Signed)
Patient discharged per MD order. Discharge instructions reviewed with patient and daughter at bedside. Packet for facility given to daughter. Called report to Casimer Lanius, RN at [facility]. Patient wheeled downstairs to the car where daughter will transport patient to [facility]. Angelena Form, RN

## 2012-06-06 NOTE — Discharge Summary (Addendum)
Physician Discharge Summary  DECLIN RAJAN UEA:[id number] DOB: [birth year] DOA: 06/03/2012  PCP: Jeoffrey Massed, MD  Admit date: 06/03/2012 Discharge date: 06/06/2012  Recommendations for Outpatient Follow-up:  1. Pt will need to follow up with PCP in 2-3 weeks post discharge 2. Please obtain BMP to evaluate electrolytes and kidney function 3. Please also check CBC to evaluate Hg and Hct levels 4. Please note that C. Diff by PCR is negative and pt was told to stop taking antibiotics as no  Infectious etiology noted during the hospital stay  5. Please note that Norvasc was started for BP control in the hospital and per daughter request, will defer the decision to continue to his PCP 6. Please also note that pt did not tolerate Vicodin well and daughter prefers not to give it to him   Discharge Diagnoses: Dehydration secondary to poor oral intake, progressive failure to thrive, diarrhea, deconditioning  Principal Problem:  *Dehydration Active Problems:  Dementia  Osteoarthritis of both knees  Elevated blood pressure reading without diagnosis of hypertension  Venous stasis dermatitis  Acquired phimosis  Diarrhea  Failure to thrive  Anemia  CKD (chronic kidney disease), stage II  Discharge Condition: Stable  Diet recommendation: Heart healthy diet discussed in details   Brief HPI: 78 y.o. male with history of dementia, bladder cancer, osteoarthritis, chronic diarrhea on Imodium, diet controlled diabetes, hypothyroidism off medications, hypertension off antihypertensive medications, and chronic kidney disease stage III, who presented with weakness and diarrhea. Due to patient's dementia most of the history was provided by patient's daughter. She indicated that patient had chronic lower extremity venous stasis changes especially in the right lower extremity with some erythema for which he was started on Augmentin on 05/20/2012, completing a ten-day course on 05/30/2012. Over the last  2 days prior to admission he had change in his diarrhea and has become more watery. His wife who recently passed away had C. difficile and daughter noted that the diarrhea that patient is having has features similar to C. difficile. She has also noted that he has had increasing weakness falls at home and loss of appetite. Patient denied any chest pain, shortness of breath, abdominal pain, headaches or vision changes. Patient was admitted for further evaluation and management.   Assessment and Plan:  1. Acute on chronic diarrhea:  Patient presented with worsening diarrhea, following a 10-day course of Augumentin therapy, raising the specter of possible C. Difficle infection.  He was started on supportive treatment with iv fluids, and has not had further diarrheal stools since hospitalization, had no abdominal pain, and was tolerating a regular diet.  Stool for C. Diff PCR is negative. 2. Dehydration:  Patient was noted to have an elevated BUN/creatinine ratio, with BUN 31 and creatinine 1.26 at presentation, consistent with dehydration, secondary to GI fluid losses and poor oral intake.  Now resolved. 3. Dementia:  Patient appears to have profound dementia, which is stable at this time.  Continued on Aricept.  4. Hypertension:  Blood pressure mildly elevated  Continue Norvasc and please note that this medication was started in the hospital  5. Venous stasis dermatitis of right anterior shin:  Mild, has minimal erythema with central shallow ulceration.  Does not appear to be infected at this time.  Appreciate wound care consultation  6. Failure to thrive:  Encouraged supplemental nutrition. Dietary consultation has been requested.  7. Osteoarthritis:  Stable/Asymptomatic.  8. Chronic kidney disease stage II/III:  Renal function at baseline.  9. Anemia, presumed to  be due to B12 deficiency:  This is mild, normocytic and reasonable/stable.  10. Diet controlled diabetes:  Controlled on  diet 11. Generalized weakness:  This is multi-factorial, secondary to deconditioning, dehydration, and age.  SNF recommended.  Consultants:  PT evaluation  Wound care  Procedures/Studies:  CT Head 11/16 --> no acute intracranial abnormalities   Antibiotics:  None  Code Status: Full  Family Communication: Pt at bedside   Discharge Exam: Filed Vitals:   06/06/12 0633  BP: 150/57  Pulse: 75  Temp: 100.7 F (38.2 C)  Resp: 16   Filed Vitals:   06/05/12 0455 06/05/12 1355 06/05/12 2324 06/06/12 0633  BP: 150/73 147/79 145/58 150/57  Pulse: 72 68 66 75  Temp: 98.4 F (36.9 C) 97.3 F (36.3 C) 97.6 F (36.4 C) 100.7 F (38.2 C)  TempSrc: Oral Oral Oral Axillary  Resp: 20 16 16 16   Height:      Weight:    63.594 kg (140 lb 3.2 oz)  SpO2: 95% 100% 96% 96%    General: Pt is alert, follows some commands appropriately, not in acute distress Cardiovascular: Regular rate and rhythm, S1/S2 +, no murmurs, no rubs, no gallops Respiratory: Clear to auscultation bilaterally, decreased breath  Abdominal: Soft, non tender, non distended, bowel sounds +, no guarding Extremities: no edema, no cyanosis, pulses palpable bilaterally DP and PT Neuro: Grossly nonfocal, alert to name only  Discharge Instructions  Discharge Orders    Future Orders Please Complete By Expires   Diet - low sodium heart healthy      Increase activity slowly        Medication List    STOP taking these medications         amoxicillin-clavulanate 875-125 MG per tablet   Commonly known as: AUGMENTIN    TAKE these medications            ARTHRITIS PAIN RELIEF 650 MG CR tablet   Generic drug: acetaminophen   Take 650 mg by mouth every 8 (eight) hours as needed. Pain      donepezil 5 MG tablet   Commonly known as: ARICEPT   Take 1 tablet (5 mg total) by mouth at bedtime.      vitamin B-12 1000 MCG tablet   Commonly known as: CYANOCOBALAMIN   Take 1,000 mcg by mouth daily.         Follow-up  Information    Follow up with MCGOWEN,PHILIP H, MD. In 2 weeks.   Contact information:   [facility] [address] [place] [address] [phone]          The results of significant diagnostics from this hospitalization (including imaging, microbiology, ancillary and laboratory) are listed below for reference.     Microbiology: Recent Results (from the past 240 hour(s))  CLOSTRIDIUM DIFFICILE BY PCR     Status: Normal   Collection Time   06/04/12  4:22 PM      Component Value Range Status Comment   C difficile by pcr NEGATIVE  NEGATIVE Final      Labs: Basic Metabolic Panel:  Lab 06/06/12 9147 06/05/12 0408 06/04/12 0417 06/03/12 1729  NA 140 138 142 142  K 4.4 4.6 4.0 4.3  CL 108 106 111 112  CO2 25 25 24 24   GLUCOSE 91 102* 70 81  BUN 26* 23 26* 31*  CREATININE 1.23 1.07 1.11 1.26  CALCIUM 8.7 8.8 8.5 8.1*  MG -- -- -- --  PHOS -- -- -- --  CBC:  Lab 06/06/12 0404 06/05/12 0408 06/04/12 0417 06/03/12 1729  WBC 12.5* 14.6* 6.6 6.3  NEUTROABS -- -- -- 4.3  HGB 11.2* 11.4* 10.8* 10.2*  HCT 34.0* 34.3* 33.2* 30.6*  MCV 96.3 96.1 97.6 96.8  PLT 175 180 171 148*   SIGNED: Time coordinating discharge: Over 30 minutes  Debbora Presto, MD  [facility] 06/06/2012, 7:06 AM Pager [phone]  If 7PM-7AM, please contact night-coverage [email or web address] Password [email or web address]

## 2012-06-06 NOTE — Progress Notes (Signed)
Clinical Social Worker facilitated pt discharge needs to [facility] including faxing pt discharge information to ALF and receiving return phone call from ALF that medications reviewed by ALF and pt can admit to ALF at any time today. Clinical Child psychotherapist discussed with pt daughter via telephone who reports that she plans to leave work at approximately 1 pm and come to hospital to transport pt via private vehicle to [facility]. Clinical Social Worker discussed with RN and provided contact number to [facility] [phone]) for RN to call report. Clinical Social Worker provided pt discharge packet at bedside for pt daughter to take to ALF. No further social work needs identified at this time. Clinical Social Worker signing off.   Jacklynn Lewis, MSW, LCSWA  Clinical Social Work [phone]

## 2012-06-06 NOTE — Telephone Encounter (Signed)
Handwritten message from [place] given to me stating PC from patient daughter who states that pt is not diabetic nor does he have kidney failure.  Joshua Gardner would like this corrected in chart.   Also states pt will be going to assisted living facility when discharged from hospital.   Message left for Joshua Gardner to return call.

## 2012-06-06 NOTE — Progress Notes (Signed)
[facility]  Patient Status: Active (receiving services up to time of hospitalization)  [facility] is providing the following services: RN, PT, OT, MSW and HHA  If patient discharges after hours, please call [phone].   Joshua Gardner 06/06/2012, 10:44 AM

## 2012-06-07 LAB — URINE CULTURE: Colony Count: >=100000

## 2012-06-07 NOTE — Telephone Encounter (Signed)
Advised of below.  Pt is now at [facility] on [facility]. Dois Davenport is electing to hold off on the circumcision for phimosis Requests a call to [facility] to discontinue services and also request delivery of wheelchair cushion they did not have when they delivered his wheelchair.  I spoke with Melissa with [facility] and she will d/c orders and make sure that the wheelchair cushion is delivered.

## 2012-06-09 ENCOUNTER — Telehealth: Payer: Self-pay | Admitting: Family Medicine

## 2012-06-09 DIAGNOSIS — M1710 Unilateral primary osteoarthritis, unspecified knee: Secondary | ICD-10-CM

## 2012-06-09 DIAGNOSIS — F0390 Unspecified dementia without behavioral disturbance: Secondary | ICD-10-CM

## 2012-06-09 DIAGNOSIS — R269 Unspecified abnormalities of gait and mobility: Secondary | ICD-10-CM

## 2012-06-09 DIAGNOSIS — M6281 Muscle weakness (generalized): Secondary | ICD-10-CM

## 2012-06-09 MED ORDER — CIPROFLOXACIN HCL 500 MG PO TABS: ORAL_TABLET | ORAL | Status: DC

## 2012-06-09 NOTE — Telephone Encounter (Signed)
eRx sent.  Call or return if not back to baseline after this 5d course of antibiotics.-thx

## 2012-06-09 NOTE — Telephone Encounter (Signed)
Dr. Milinda Cave, please do RX for antibiotic for UTI.  Pt is complaining of symptoms.

## 2012-06-09 NOTE — Telephone Encounter (Signed)
Message left on work voicemail with instructions.  Advised to call with any questions.

## 2012-06-16 DIAGNOSIS — E119 Type 2 diabetes mellitus without complications: Secondary | ICD-10-CM

## 2012-06-16 DIAGNOSIS — E860 Dehydration: Secondary | ICD-10-CM

## 2012-06-16 DIAGNOSIS — R197 Diarrhea, unspecified: Secondary | ICD-10-CM

## 2012-06-16 DIAGNOSIS — F0390 Unspecified dementia without behavioral disturbance: Secondary | ICD-10-CM

## 2012-06-19 ENCOUNTER — Encounter: Payer: Self-pay | Admitting: Family Medicine

## 2012-06-19 ENCOUNTER — Ambulatory Visit (INDEPENDENT_AMBULATORY_CARE_PROVIDER_SITE_OTHER): Payer: Medicare Other | Admitting: Family Medicine

## 2012-06-19 VITALS — BP 115/68 | HR 85 | Temp 98.8°F

## 2012-06-19 DIAGNOSIS — R627 Adult failure to thrive: Secondary | ICD-10-CM

## 2012-06-19 DIAGNOSIS — IMO0002 Reserved for concepts with insufficient information to code with codable children: Secondary | ICD-10-CM

## 2012-06-19 DIAGNOSIS — N390 Urinary tract infection, site not specified: Secondary | ICD-10-CM

## 2012-06-19 DIAGNOSIS — F0390 Unspecified dementia without behavioral disturbance: Secondary | ICD-10-CM

## 2012-06-19 DIAGNOSIS — N189 Chronic kidney disease, unspecified: Secondary | ICD-10-CM

## 2012-06-19 DIAGNOSIS — Z01818 Encounter for other preprocedural examination: Secondary | ICD-10-CM

## 2012-06-19 DIAGNOSIS — R9431 Abnormal electrocardiogram [ECG] [EKG]: Secondary | ICD-10-CM

## 2012-06-19 DIAGNOSIS — Z01810 Encounter for preprocedural cardiovascular examination: Secondary | ICD-10-CM

## 2012-06-19 DIAGNOSIS — N471 Phimosis: Secondary | ICD-10-CM

## 2012-06-19 DIAGNOSIS — D72829 Elevated white blood cell count, unspecified: Secondary | ICD-10-CM

## 2012-06-19 DIAGNOSIS — M545 Low back pain: Secondary | ICD-10-CM

## 2012-06-19 LAB — POCT URINALYSIS DIPSTICK
Bilirubin, UA: NEGATIVE
Glucose, UA: NEGATIVE
Ketones, UA: NEGATIVE
Nitrite, UA: NEGATIVE
Spec Grav, UA: 1.025
pH, UA: 5

## 2012-06-19 LAB — CBC
MCH: 31.4 pg (ref 26.0–34.0)
MCHC: 32.9 g/dL (ref 30.0–36.0)
MCV: 95.4 fL (ref 78.0–100.0)
Platelets: 297 10*3/uL (ref 150–400)
RDW: 13.5 % (ref 11.5–15.5)

## 2012-06-19 MED ORDER — ACETAMINOPHEN 500 MG PO CAPS: ORAL_CAPSULE | ORAL | Status: DC

## 2012-06-19 NOTE — Progress Notes (Signed)
OFFICE NOTE  06/21/2012  CC:  Chief Complaint  Patient presents with  . Follow-up    hospital, also having back pain, ? no BM x 2 weeks per patient-daughter thinks BM recently     HPI: Patient is a 78 y.o. Caucasian male who is here for hospital follow up: 11/16-11/19 for failure to thrive and dehydration from acute diarrhea illness.  His diarrhea resolved in hospital, C diff was negative, he had mild prerenal azotemia which resolved with some fluids.  They started norvasc 5mg  but daughter says she has not continued this med out of the hospital at the instruction of the hospitalist and b/c she has noted normal bp off of it. Pt is wheelchair bound, can get up and walk a few steps at the most.  He still desires a circumcision for his phimosis and the urologist has requested surgical clearance.  Currently he says he is urinating fine and his daughter reports that the phimosis has resolved for the time being---swelling is gone, discomfort is gone.  However, pt reiterates that this condition comes and goes frequently.  Since d/c from hosp his daughter called our office saying he had some persistent confusion that was not usual for him, so I started abx for presumed UTI.  She says his confusion cleared up with this and he is back at his baseline dementia with only brief, intermittent periods of confusion.  Pertinent PMH:  Past Medical History  Diagnosis Date  . History of bladder cancer   . Dementia     With hallucinations/paranoia.  July 2013 CT brain: atrophy + small vessel ischemic demyelination  . Osteoarthritis of both knees   . Urinary incontinence   . History of non anemic vitamin B12 deficiency 08/2011 (level was 171)    Vit B12 level normal (466) 11/2011 at previous PCP.  Marland Kitchen History of hypothyroidism     currently off meds  . History of hypertension     Normotensive off meds as of 11/2011  . Chronic renal insufficiency, stage III (moderate)     Borderline stage II/III (Est CrCl about  60).    MEDS:  Outpatient Prescriptions Prior to Visit  Medication Sig Dispense Refill  . donepezil (ARICEPT) 5 MG tablet Take 1 tablet (5 mg total) by mouth at bedtime.  30 tablet  1  . vitamin B-12 (CYANOCOBALAMIN) 1000 MCG tablet Take 1,000 mcg by mouth daily.      Marland Kitchen acetaminophen (ARTHRITIS PAIN RELIEF) 650 MG CR tablet Take 650 mg by mouth every 8 (eight) hours as needed. Pain      . ciprofloxacin (CIPRO) 500 MG tablet 1 tab po bid x 5d  10 tablet  0    PE: Blood pressure 115/68, pulse 85, temperature 98.8 F (37.1 C), temperature source Temporal. Gen: elderly, frail-appearing white male sitting in wheelchair.  Answers questions but usually after the question is repeated by his daughter.  Oriented to person, place, and situation. Oral mucosa pink and moist. CV: RRR LUNGS: CTA bilat, nonlabored. ABD: soft, NT/ND  LAB:  12 lead EKG today: NSR, rate 78, poor R wave progression, TWI in I, aVL, and V6. No old EKG for comparison.  IMPRESSION AND PLAN:  Preoperative clearance Needs circumcision b/c phimosis is a problem for him. However, with abnormal EKG here today I feel he needs a cardiologist to review his situation and see if he needs any further cardiac testing prior to giving surgical clearance. Referral ordered today.  UTI (lower urinary tract infection) Resolved.  His UA today has a trace of blood but since these dipsticks are ultrasensitive I'll send a urinalysis with microscopy to our outside lab for confirmation and I'll also send the sample for c/s.  Mechanical low back pain His daughter will encourage his caregivers at his SNF to help him keep good position in his wheelchair. Tylenol order changed to 1000 mg tid today.  Failure to thrive He is s/p recent hosp for this plus his acute diarrhea illness that led to dehydration. He is slowly improving PO intake.  Will recheck BMET and CBC today since his labs in hosp did still show hyponatremia and leukocytosis. He'll  continue at his SNF and we'll work on getting his chronic problems better so he feels better and is better able to eat and thrive nutritionally.  Dementia Stable. His daughter still thinks he got a little improvement from aricept 5mg  qd but she is hesitant to increase to 10mg  dose at this time so we'll hold off. Continue 5mg  aricept qd.  Acquired phimosis This is improved but he will still need circumcision. He says the condition comes and goes and is very uncomfortable when it is present, as we witnessed the last time he was in the office. After surgical clearance, he'll get circ-he is already tied in to urologist at this time and in addition to the circ they'll be repeating surveillance for his history of bladder cancer.   An After Visit Summary was printed and given to the patient.  FOLLOW UP: 1 mo

## 2012-06-20 LAB — URINALYSIS, MICROSCOPIC ONLY
Casts: NONE SEEN
Crystals: NONE SEEN
Squamous Epithelial / LPF: NONE SEEN

## 2012-06-21 ENCOUNTER — Other Ambulatory Visit: Payer: Self-pay | Admitting: Family Medicine

## 2012-06-21 ENCOUNTER — Ambulatory Visit: Payer: Medicare Other | Admitting: Cardiology

## 2012-06-21 DIAGNOSIS — M545 Low back pain: Secondary | ICD-10-CM | POA: Insufficient documentation

## 2012-06-21 DIAGNOSIS — N390 Urinary tract infection, site not specified: Secondary | ICD-10-CM | POA: Insufficient documentation

## 2012-06-21 DIAGNOSIS — Z01818 Encounter for other preprocedural examination: Secondary | ICD-10-CM | POA: Insufficient documentation

## 2012-06-21 LAB — URINE CULTURE

## 2012-06-21 MED ORDER — FOLIC ACID 1 MG PO TABS: 1.0000 mg | ORAL_TABLET | Freq: Every day | ORAL | Status: DC

## 2012-06-21 NOTE — Assessment & Plan Note (Signed)
Stable. His daughter still thinks he got a little improvement from aricept 5mg  qd but she is hesitant to increase to 10mg  dose at this time so we'll hold off. Continue 5mg  aricept qd.

## 2012-06-21 NOTE — Assessment & Plan Note (Signed)
Resolved. His UA today has a trace of blood but since these dipsticks are ultrasensitive I'll send a urinalysis with microscopy to our outside lab for confirmation and I'll also send the sample for c/s.

## 2012-06-21 NOTE — Assessment & Plan Note (Signed)
Needs circumcision b/c phimosis is a problem for him. However, with abnormal EKG here today I feel he needs a cardiologist to review his situation and see if he needs any further cardiac testing prior to giving surgical clearance. Referral ordered today.

## 2012-06-21 NOTE — Assessment & Plan Note (Signed)
He is s/p recent hosp for this plus his acute diarrhea illness that led to dehydration. He is slowly improving PO intake.  Will recheck BMET and CBC today since his labs in hosp did still show hyponatremia and leukocytosis. He'll continue at his SNF and we'll work on getting his chronic problems better so he feels better and is better able to eat and thrive nutritionally.

## 2012-06-21 NOTE — Assessment & Plan Note (Signed)
This is improved but he will still need circumcision. He says the condition comes and goes and is very uncomfortable when it is present, as we witnessed the last time he was in the office. After surgical clearance, he'll get circ-he is already tied in to urologist at this time and in addition to the circ they'll be repeating surveillance for his history of bladder cancer.

## 2012-06-21 NOTE — Assessment & Plan Note (Signed)
His daughter will encourage his caregivers at his SNF to help him keep good position in his wheelchair. Tylenol order changed to 1000 mg tid today.

## 2012-06-27 ENCOUNTER — Emergency Department (HOSPITAL_COMMUNITY)
Admission: EM | Admit: 2012-06-27 | Discharge: 2012-06-27 | Disposition: A | Discharge status: Home / Self Care | Payer: Medicare Other | Attending: Emergency Medicine | Admitting: Emergency Medicine

## 2012-06-27 ENCOUNTER — Emergency Department (HOSPITAL_COMMUNITY): Payer: Medicare Other

## 2012-06-27 ENCOUNTER — Encounter (HOSPITAL_COMMUNITY): Payer: Self-pay

## 2012-06-27 DIAGNOSIS — F22 Delusional disorders: Secondary | ICD-10-CM | POA: Insufficient documentation

## 2012-06-27 DIAGNOSIS — Z8551 Personal history of malignant neoplasm of bladder: Secondary | ICD-10-CM | POA: Insufficient documentation

## 2012-06-27 DIAGNOSIS — M549 Dorsalgia, unspecified: Secondary | ICD-10-CM

## 2012-06-27 DIAGNOSIS — N183 Chronic kidney disease, stage 3 unspecified: Secondary | ICD-10-CM | POA: Insufficient documentation

## 2012-06-27 DIAGNOSIS — Z862 Personal history of diseases of the blood and blood-forming organs and certain disorders involving the immune mechanism: Secondary | ICD-10-CM | POA: Insufficient documentation

## 2012-06-27 DIAGNOSIS — N390 Urinary tract infection, site not specified: Secondary | ICD-10-CM | POA: Insufficient documentation

## 2012-06-27 DIAGNOSIS — F0392 Unspecified dementia, unspecified severity, with psychotic disturbance: Secondary | ICD-10-CM | POA: Insufficient documentation

## 2012-06-27 DIAGNOSIS — Z791 Long term (current) use of non-steroidal anti-inflammatories (NSAID): Secondary | ICD-10-CM | POA: Insufficient documentation

## 2012-06-27 DIAGNOSIS — Z8639 Personal history of other endocrine, nutritional and metabolic disease: Secondary | ICD-10-CM | POA: Insufficient documentation

## 2012-06-27 DIAGNOSIS — D518 Other vitamin B12 deficiency anemias: Secondary | ICD-10-CM | POA: Insufficient documentation

## 2012-06-27 DIAGNOSIS — Z8679 Personal history of other diseases of the circulatory system: Secondary | ICD-10-CM | POA: Insufficient documentation

## 2012-06-27 DIAGNOSIS — M1990 Unspecified osteoarthritis, unspecified site: Secondary | ICD-10-CM | POA: Insufficient documentation

## 2012-06-27 DIAGNOSIS — Z79899 Other long term (current) drug therapy: Secondary | ICD-10-CM | POA: Insufficient documentation

## 2012-06-27 LAB — URINE MICROSCOPIC-ADD ON

## 2012-06-27 LAB — URINALYSIS, ROUTINE W REFLEX MICROSCOPIC
Bilirubin Urine: NEGATIVE
Glucose, UA: NEGATIVE mg/dL
Nitrite: POSITIVE — AB
Specific Gravity, Urine: 1.012 (ref 1.005–1.030)
pH: 6.5 (ref 5.0–8.0)

## 2012-06-27 LAB — GLUCOSE, CAPILLARY: Glucose-Capillary: 93 mg/dL (ref 70–99)

## 2012-06-27 MED ORDER — IBUPROFEN 200 MG PO TABS
400.0000 mg | ORAL_TABLET | Freq: Once | ORAL | Status: AC
  Administered 2012-06-27: 400 mg via ORAL
  Filled 2012-06-27: qty 2

## 2012-06-27 MED ORDER — CODEINE SULFATE 30 MG PO TABS: 30.0000 mg | ORAL_TABLET | Freq: Four times a day (QID) | ORAL | Status: DC | PRN

## 2012-06-27 MED ORDER — SULFAMETHOXAZOLE-TRIMETHOPRIM 800-160 MG PO TABS: 1.0000 | ORAL_TABLET | Freq: Two times a day (BID) | ORAL | Status: DC

## 2012-06-27 MED ORDER — SULFAMETHOXAZOLE-TMP DS 800-160 MG PO TABS
1.0000 | ORAL_TABLET | Freq: Once | ORAL | Status: AC
  Administered 2012-06-27: 1 via ORAL
  Filled 2012-06-27: qty 1

## 2012-06-27 NOTE — ED Provider Notes (Signed)
History     CSN: [id number]  Arrival date & time 06/27/12  1432   First MD Initiated Contact with Patient 06/27/12 1500      Chief Complaint  Patient presents with  . Back Pain   Level V caveat for dementia  (Consider location/radiation/quality/duration/timing/severity/associated sxs/prior treatment) HPI  Patient presents emergency Department with his daughter. She reports he was placed in a nursing home on November 9 for the first time. She states since he has been in a nursing home he has started using a wheelchair however when he sits in a wheelchair he slides down when he sitting in it. He is started complaining of having back pain since she started using the wheelchair. She denies any other known injury although she states he did fall once in his room while he was standing up to get dressed however he was are already starting to have back pain when that happened. She states that he sleeps all the time "all his life" which is his usual routines though there is no change in his mental status. Patient is noted to be sleeping during most of his exam. She reports he was recently treated for UTI and he had a repeat urine done recently that was normal. Patient has a phimosis and has been seen by a urologist to discuss circumcision however he needs a cardiology evaluation to see if he would do well with surgery.  PCP Dr. Milinda Cave  Past Medical History  Diagnosis Date  . History of bladder cancer   . Dementia     With hallucinations/paranoia.  July 2013 CT brain: atrophy + small vessel ischemic demyelination  . Osteoarthritis of both knees   . Urinary incontinence   . History of non anemic vitamin B12 deficiency 08/2011 (level was 171)    Vit B12 level normal (466) 11/2011 at previous PCP.  Marland Kitchen History of hypothyroidism     currently off meds  . History of hypertension     Normotensive off meds as of 11/2011  . Chronic renal insufficiency, stage III (moderate)     Borderline stage II/III  (Est CrCl about 60).    Past Surgical History  Procedure Date  . Hernia repair   . Shoulder surgery     Right  . Cholecystectomy 1980s    Family History  Problem Relation Age of Onset  . Congestive Heart Failure Mother     History  Substance Use Topics  . Smoking status: Never Smoker   . Smokeless tobacco: Never Used  . Alcohol Use: No   Lives in a nursing home   Review of Systems  Unable to perform ROS: Dementia    Allergies  Keflex  Home Medications   Current Outpatient Rx  Name  Route  Sig  Dispense  Refill  . ACETAMINOPHEN 500 MG PO CAPS      2 caps po tid   180 capsule   3   . DONEPEZIL HCL 5 MG PO TABS   Oral   Take 1 tablet (5 mg total) by mouth at bedtime.   30 tablet   1   . FOLIC ACID 1 MG PO TABS   Oral   Take 1 tablet (1 mg total) by mouth daily.   30 tablet   11   . VITAMIN B-12 1000 MCG PO TABS   Oral   Take 1,000 mcg by mouth daily.         . CODEINE SULFATE 30 MG PO TABS   Oral  Take 1 tablet (30 mg total) by mouth every 6 (six) hours as needed for pain.   30 tablet   0   . SULFAMETHOXAZOLE-TRIMETHOPRIM 800-160 MG PO TABS   Oral   Take 1 tablet by mouth 2 (two) times daily.   28 tablet   0     BP 169/79  Pulse 67  Temp 97.7 F (36.5 C) (Oral)  Resp 18  SpO2 100%  Vital signs normal except hypertension   Physical Exam  Nursing note and vitals reviewed. Constitutional:  Non-toxic appearance. He does not appear ill. No distress.       Patient sleeping but easily awakened Elderly male  HENT:  Head: Normocephalic and atraumatic.  Right Ear: External ear normal.  Left Ear: External ear normal.  Nose: Nose normal. No mucosal edema or rhinorrhea.  Mouth/Throat: Mucous membranes are normal. No dental abscesses or uvula swelling.       Dry tongue  Eyes: Conjunctivae normal and EOM are normal. Pupils are equal, round, and reactive to light.  Neck: Normal range of motion and full passive range of motion without  pain. Neck supple.  Cardiovascular: Normal rate, regular rhythm and normal heart sounds.  Exam reveals no gallop and no friction rub.   No murmur heard. Pulmonary/Chest: Effort normal and breath sounds normal. No respiratory distress. He has no wheezes. He has no rhonchi. He has no rales. He exhibits no tenderness and no crepitus.  Abdominal: Soft. Normal appearance and bowel sounds are normal. He exhibits no distension. There is no tenderness. There is no rebound and no guarding.  Musculoskeletal: Normal range of motion. He exhibits no edema and no tenderness.       On exam of patient's back he appears to have some pain in his lower lumbar/sacral region that reproduces his complaints of pain  Neurological: He is alert. He has normal strength. No cranial nerve deficit.  Skin: Skin is warm, dry and intact. No rash noted. No erythema. No pallor.  Psychiatric: He has a normal mood and affect. His speech is normal and behavior is normal. His mood appears not anxious.    ED Course  Procedures (including critical care time)  Medications  ibuprofen (ADVIL,MOTRIN) tablet 400 mg (400 mg Oral Given 06/27/12 1557)  sulfamethoxazole-trimethoprim (BACTRIM DS) 800-160 MG per tablet 1 tablet (1 tablet Oral Given 06/27/12 1840)     Review of labs showed she had a urine culture done December 4 which only had 10,000 colonies of yeast.  Daughter is very concerned about choice of pain medicine. She states tramadol makes him "loopy". She does not want hydrocodone or oxycodone because it makes him sleep too much. He was given ibuprofen in the ED but relates he still has pain. I suggested we try plain codeine in addition to his Tylenol that he already takes and she is agreeable. She is going to discuss with the physical therapy at his facility to see if there is something they can do to his wheelchair to keep him from sliding down and having bad posture while he is in a wheelchair.  He was started on Septra for  his UTI   Results for orders placed during the hospital encounter of 06/27/12  GLUCOSE, CAPILLARY      Component Value Range   Glucose-Capillary 93  70 - 99 mg/dL  URINALYSIS, ROUTINE W REFLEX MICROSCOPIC      Component Value Range   Color, Urine YELLOW  YELLOW   APPearance CLOUDY (*) CLEAR  Specific Gravity, Urine 1.012  1.005 - 1.030   pH 6.5  5.0 - 8.0   Glucose, UA NEGATIVE  NEGATIVE mg/dL   Hgb urine dipstick MODERATE (*) NEGATIVE   Bilirubin Urine NEGATIVE  NEGATIVE   Ketones, ur NEGATIVE  NEGATIVE mg/dL   Protein, ur NEGATIVE  NEGATIVE mg/dL   Urobilinogen, UA 0.2  0.0 - 1.0 mg/dL   Nitrite POSITIVE (*) NEGATIVE   Leukocytes, UA LARGE (*) NEGATIVE  URINE MICROSCOPIC-ADD ON      Component Value Range   Squamous Epithelial / LPF FEW (*) RARE   WBC, UA TOO NUMEROUS TO COUNT  <3 WBC/hpf   RBC / HPF 11-20  <3 RBC/hpf   Bacteria, UA FEW (*) RARE   Laboratory interpretation all normal except UTI   Dg Lumbar Spine Complete  06/27/2012  *RADIOLOGY REPORT*  Clinical Data: Mid low back pain, no known injury  LUMBAR SPINE - COMPLETE 4+ VIEW  Comparison: None.  Findings: Lumbar-type vertebral bodies.  Exaggerated lumbar lordosis with mild levoscoliosis.  No evidence of fracture or dislocation.  Vertebral body heights are maintained.  Moderate multilevel degenerative changes.  Mild superior endplate changes at L4 are likely degenerative.  Visualized bony pelvis appears intact.  Vascular calcifications.  IMPRESSION: No fracture or dislocation is seen.  Moderate multilevel degenerative changes.   Original Report Authenticated By: Charline Bills, M.D.    Ct Head Wo Contrast  06/03/2012  *RADIOLOGY REPORT*  Clinical Data: New onset of weakness.  CT HEAD WITHOUT CONTRAST  Technique:  Contiguous axial images were obtained from the base of the skull through the vertex without contrast.  Comparison: 02/07/2012  Findings: The patient has diffuse cerebral atrophy.  There is no evidence to  suggest an intracranial hemorrhage, mass lesion, midline shift, hydrocephalus or large infarct. There is diffuse mucosal disease in the paranasal sinuses and there may be a small osteoid osteoma in the left frontal ethmoid region.  Cannot exclude partial ethmoidectomy.  No acute bony abnormality.  IMPRESSION: Stable head CT findings.  No acute intracranial abnormality.  Paranasal sinus disease.   Original Report Authenticated By: Richarda Overlie, M.D.      1. UTI (lower urinary tract infection)   2. Back pain    New Prescriptions   CODEINE 30 MG TABLET    Take 1 tablet (30 mg total) by mouth every 6 (six) hours as needed for pain.   SULFAMETHOXAZOLE-TRIMETHOPRIM (SEPTRA DS) 800-160 MG PER TABLET    Take 1 tablet by mouth 2 (two) times daily.    Plan discharge  Devoria Albe, MD, FACEP    MDM          Ward Givens, MD 06/27/12 [phone]

## 2012-06-27 NOTE — ED Notes (Signed)
Off floor for testing 

## 2012-06-27 NOTE — ED Notes (Signed)
RUE:[id number]<JW> Expected date:06/27/12<BR> Expected time:<BR> Means of arrival:<BR> Comments:<BR> ems

## 2012-06-27 NOTE — Progress Notes (Signed)
CSW met with Joshua Gardner and Joshua Gardner daughter Dois Davenport at bedside. Joshua Gardner resting comfortably in bed however very lethargic. Joshua Gardner daughter shared that patient recently moved to [facility] in November. Patient daughter is hopeful for patient to return to [facility] when medically stable. CSW awaiting further evaluation to determine patient possible disposition needs.   Catha Gosselin, LCSWA  [phone] 06/27/2012.

## 2012-06-27 NOTE — ED Notes (Signed)
Per EMS, Pt, from [facility], sts low back pain x 2 weeks. Pain score 10/10.  Facility sts "physician onsite ruled out kidney stones or kidney problems x 1 week ago."  Pt and facility deny injury.  Vitals stable.  Pt very lethargic.  Facility sts they did not give him anything for pain.

## 2012-06-30 ENCOUNTER — Encounter: Payer: Self-pay | Admitting: Family Medicine

## 2012-06-30 ENCOUNTER — Ambulatory Visit (INDEPENDENT_AMBULATORY_CARE_PROVIDER_SITE_OTHER): Payer: Medicare Other | Admitting: Family Medicine

## 2012-06-30 VITALS — BP 104/67 | HR 72 | Temp 97.8°F

## 2012-06-30 DIAGNOSIS — N478 Other disorders of prepuce: Secondary | ICD-10-CM

## 2012-06-30 DIAGNOSIS — N390 Urinary tract infection, site not specified: Secondary | ICD-10-CM

## 2012-06-30 DIAGNOSIS — M545 Low back pain: Secondary | ICD-10-CM

## 2012-06-30 DIAGNOSIS — F0390 Unspecified dementia without behavioral disturbance: Secondary | ICD-10-CM

## 2012-06-30 DIAGNOSIS — D649 Anemia, unspecified: Secondary | ICD-10-CM

## 2012-06-30 DIAGNOSIS — N471 Phimosis: Secondary | ICD-10-CM

## 2012-06-30 LAB — POCT URINALYSIS DIPSTICK
Blood, UA: NEGATIVE
Nitrite, UA: NEGATIVE
Protein, UA: NEGATIVE
Spec Grav, UA: 1.02
Urobilinogen, UA: 0.2
pH, UA: 6

## 2012-06-30 NOTE — Progress Notes (Signed)
OFFICE VISIT  06/30/2012   CC:  Chief Complaint  Patient presents with  . Follow-up    ER follow up     HPI:    Patient is a 78 y.o. Caucasian male who presents for follow up from ED visit 3 days ago.  He was taken in for back pain, dx'd with musculoskeletal back pain and a UTI and was started on bactrim and codeine 30mg  to add to his tylenol for attempt at better pain control.  The UTI dx was based on a CC UA with micro, and although it appears in the computer to have been sent for c/s, we called the lab and they said they do not have a sample of urine for him---so evidently this urine clx was never sent/done.   He has no complaint of dysuria or lower abd pain or nausea.  No fevers.    He gets a bit drowsy and "foggyheaded" on the codeine but his pain is better controlled.  He and his daughter seem ok with continuing with this med prn pain, plus PT will work with him as much as possible with posture and they are looking into options for new wheelchair seat that helps keep him from sliding forward. He says he has pain today in his back diffusely and he says it is mild.  He has started his folate.  This mild deficiency may be the etiology of his mild normocytic anemia.  From a dementia standpoint he seems unchanged per daughter.  She is comfortable keeping him on aricept 5mg  qd for the time being.  For his phimosis, this seems to have resolved for now anyway.  However, I still feel like he needs a circumcision and after initial hesitancy his daughter now agrees to proceed with the cardiac clearance for this surgery.   Past Medical History  Diagnosis Date  . History of bladder cancer   . Dementia     With hallucinations/paranoia.  July 2013 CT brain: atrophy + small vessel ischemic demyelination  . Osteoarthritis of both knees   . Urinary incontinence   . History of non anemic vitamin B12 deficiency 08/2011 (level was 171)    Vit B12 level normal (466) 11/2011 at previous PCP.  Marland Kitchen  History of hypothyroidism     currently off meds  . History of hypertension     Normotensive off meds as of 11/2011  . Chronic renal insufficiency, stage III (moderate)     Borderline stage II/III (Est CrCl about 60).    Past Surgical History  Procedure Date  . Hernia repair   . Shoulder surgery     Right  . Cholecystectomy 1980s    Outpatient Prescriptions Prior to Visit  Medication Sig Dispense Refill  . Acetaminophen 500 MG coapsule 2 caps po tid  180 capsule  3  . codeine 30 MG tablet Take 1 tablet (30 mg total) by mouth every 6 (six) hours as needed for pain.  30 tablet  0  . donepezil (ARICEPT) 5 MG tablet Take 1 tablet (5 mg total) by mouth at bedtime.  30 tablet  1  . folic acid (FOLVITE) 1 MG tablet Take 1 tablet (1 mg total) by mouth daily.  30 tablet  11  . sulfamethoxazole-trimethoprim (SEPTRA DS) 800-160 MG per tablet Take 1 tablet by mouth 2 (two) times daily.  28 tablet  0  . vitamin B-12 (CYANOCOBALAMIN) 1000 MCG tablet Take 1,000 mcg by mouth daily.      Last reviewed on  06/30/2012  8:59 AM by Jeoffrey Massed, MD  Allergies  Allergen Reactions  . Keflex (Cephalexin)     Doesn't remember reaction    ROS As per HPI  PE: Blood pressure 104/67, pulse 72, temperature 97.8 F (36.6 C), temperature source Temporal, SpO2 98.00%. GEN: alert, attends well.  Oriented to person and place but not time.  Sits quietly and speaks only when spoken to. On MMSE he demonstrated poor concentration ability and 3 word recall/short term memory.  He attended well and followed commands fine, was able to draw clock face with hands at 3 oclock position moderately well. CV: RRR, no m/r/g LUNGS: CTA bilat, nonlabored resps, good aeration.  LABS:  CC UA today showed small leukocyte esterase but otherwise normal.  IMPRESSION AND PLAN:  UTI (lower urinary tract infection) ED never sent specimen for culture/sensitivity. Will keep him on his abx and recollect urine for UA and send for  c/s today.  Mechanical low back pain Since living in [place] he has been sitting in his wheelchair MUCH more.  His posture is slumped. PT is working with him some at his NH, uses 4 point walker. Seems to be tolerating codeine 30mg  q6h prn in addition to his tylenol for pain, so we'll continue this.  Acquired phimosis He needs circumcision.  However, he needs to see cardiology and get eval before he can be cleared for this surgery. His daughter cancelled his initial appt with them but now she feels like it is the better option so she'll contact [facility] to reschedule this appt.  Anemia Normocytic anemia: very mild folate deficiency. Started folate replacement/supplement recently.  Dementia Stable, moderate severity. Continue aricept 5mg  daily--this is the dose that his daughter is comfortable with right now.     An After Visit Summary was printed and given to the patient.  FOLLOW UP: Return in about 1 month (around 07/31/2012) for f/u dementia and phimosis/recurrent UTI.

## 2012-06-30 NOTE — Assessment & Plan Note (Signed)
He needs circumcision.  However, he needs to see cardiology and get eval before he can be cleared for this surgery. His daughter cancelled his initial appt with them but now she feels like it is the better option so she'll contact [facility] to reschedule this appt.

## 2012-06-30 NOTE — Assessment & Plan Note (Signed)
Since living in [place] he has been sitting in his wheelchair MUCH more.  His posture is slumped. PT is working with him some at his NH, uses 4 point walker. Seems to be tolerating codeine 30mg  q6h prn in addition to his tylenol for pain, so we'll continue this.

## 2012-06-30 NOTE — Patient Instructions (Signed)
Call [facility] to reschedule cardiology appointment: [phone].

## 2012-06-30 NOTE — Assessment & Plan Note (Signed)
Normocytic anemia: very mild folate deficiency. Started folate replacement/supplement recently.

## 2012-06-30 NOTE — Assessment & Plan Note (Signed)
Stable, moderate severity. Continue aricept 5mg  daily--this is the dose that his daughter is comfortable with right now.

## 2012-06-30 NOTE — Assessment & Plan Note (Signed)
ED never sent specimen for culture/sensitivity. Will keep him on his abx and recollect urine for UA and send for c/s today.

## 2012-07-02 LAB — URINE CULTURE

## 2012-07-05 ENCOUNTER — Emergency Department (HOSPITAL_COMMUNITY): Payer: Medicare Other

## 2012-07-05 ENCOUNTER — Encounter (HOSPITAL_COMMUNITY): Payer: Self-pay | Admitting: Emergency Medicine

## 2012-07-05 ENCOUNTER — Emergency Department (HOSPITAL_COMMUNITY)
Admission: EM | Admit: 2012-07-05 | Discharge: 2012-07-06 | Disposition: A | Discharge status: Home / Self Care | Payer: Medicare Other | Attending: Emergency Medicine | Admitting: Emergency Medicine

## 2012-07-05 DIAGNOSIS — N183 Chronic kidney disease, stage 3 unspecified: Secondary | ICD-10-CM | POA: Insufficient documentation

## 2012-07-05 DIAGNOSIS — D518 Other vitamin B12 deficiency anemias: Secondary | ICD-10-CM | POA: Insufficient documentation

## 2012-07-05 DIAGNOSIS — Z79899 Other long term (current) drug therapy: Secondary | ICD-10-CM | POA: Insufficient documentation

## 2012-07-05 DIAGNOSIS — Z8639 Personal history of other endocrine, nutritional and metabolic disease: Secondary | ICD-10-CM | POA: Insufficient documentation

## 2012-07-05 DIAGNOSIS — W19XXXA Unspecified fall, initial encounter: Secondary | ICD-10-CM

## 2012-07-05 DIAGNOSIS — IMO0002 Reserved for concepts with insufficient information to code with codable children: Secondary | ICD-10-CM | POA: Insufficient documentation

## 2012-07-05 DIAGNOSIS — Z862 Personal history of diseases of the blood and blood-forming organs and certain disorders involving the immune mechanism: Secondary | ICD-10-CM | POA: Insufficient documentation

## 2012-07-05 DIAGNOSIS — F0390 Unspecified dementia without behavioral disturbance: Secondary | ICD-10-CM | POA: Insufficient documentation

## 2012-07-05 DIAGNOSIS — M1990 Unspecified osteoarthritis, unspecified site: Secondary | ICD-10-CM | POA: Insufficient documentation

## 2012-07-05 DIAGNOSIS — Z8551 Personal history of malignant neoplasm of bladder: Secondary | ICD-10-CM | POA: Insufficient documentation

## 2012-07-05 DIAGNOSIS — Y9289 Other specified places as the place of occurrence of the external cause: Secondary | ICD-10-CM | POA: Insufficient documentation

## 2012-07-05 DIAGNOSIS — W1809XA Striking against other object with subsequent fall, initial encounter: Secondary | ICD-10-CM | POA: Insufficient documentation

## 2012-07-05 DIAGNOSIS — S0993XA Unspecified injury of face, initial encounter: Secondary | ICD-10-CM | POA: Insufficient documentation

## 2012-07-05 DIAGNOSIS — M549 Dorsalgia, unspecified: Secondary | ICD-10-CM

## 2012-07-05 DIAGNOSIS — I120 Hypertensive chronic kidney disease with stage 5 chronic kidney disease or end stage renal disease: Secondary | ICD-10-CM | POA: Insufficient documentation

## 2012-07-05 DIAGNOSIS — Y9389 Activity, other specified: Secondary | ICD-10-CM | POA: Insufficient documentation

## 2012-07-05 DIAGNOSIS — Z87448 Personal history of other diseases of urinary system: Secondary | ICD-10-CM | POA: Insufficient documentation

## 2012-07-05 LAB — BASIC METABOLIC PANEL
BUN: 27 mg/dL — ABNORMAL HIGH (ref 6–23)
Calcium: 9.7 mg/dL (ref 8.4–10.5)
Chloride: 106 mEq/L (ref 96–112)
Creatinine, Ser: 1.67 mg/dL — ABNORMAL HIGH (ref 0.50–1.35)
GFR calc Af Amer: 41 mL/min — ABNORMAL LOW (ref >90)

## 2012-07-05 LAB — CBC WITH DIFFERENTIAL/PLATELET
Basophils Relative: 1 % (ref 0–1)
Eosinophils Relative: 7 % — ABNORMAL HIGH (ref 0–5)
HCT: 36.1 % — ABNORMAL LOW (ref 39.0–52.0)
Hemoglobin: 12.1 g/dL — ABNORMAL LOW (ref 13.0–17.0)
MCH: 32 pg (ref 26.0–34.0)
MCHC: 33.5 g/dL (ref 30.0–36.0)
MCV: 95.5 fL (ref 78.0–100.0)
Monocytes Absolute: 0.8 10*3/uL (ref 0.1–1.0)
Monocytes Relative: 12 % (ref 3–12)
Neutro Abs: 4.3 10*3/uL (ref 1.7–7.7)

## 2012-07-05 LAB — URINALYSIS, ROUTINE W REFLEX MICROSCOPIC
Bilirubin Urine: NEGATIVE
Glucose, UA: NEGATIVE mg/dL
Ketones, ur: NEGATIVE mg/dL
Nitrite: NEGATIVE
pH: 6.5 (ref 5.0–8.0)

## 2012-07-05 MED ORDER — ACETAMINOPHEN 325 MG PO TABS
650.0000 mg | ORAL_TABLET | Freq: Once | ORAL | Status: AC
  Administered 2012-07-05: 650 mg via ORAL

## 2012-07-05 MED ORDER — LACTATED RINGERS IV BOLUS (SEPSIS)
500.0000 mL | Freq: Once | INTRAVENOUS | Status: AC
  Administered 2012-07-05: 500 mL via INTRAVENOUS

## 2012-07-05 NOTE — ED Provider Notes (Signed)
History     CSN: [id number]  Arrival date & time 07/05/12  1458   First MD Initiated Contact with Patient 07/05/12 1550      Chief Complaint  Patient presents with  . Fall    (Consider location/radiation/quality/duration/timing/severity/associated sxs/prior treatment) HPIGeorge M Musa is a 78 y.o. male arrives via EMS with c-collar on a backboard from nursing home, patient says he fell on his wheelchair we'll wall trying to get up his bathroom. He says he hit his head denies loss of consciousness. Patient has a history of dementia but is alert and oriented. Patient complaining about some back pain, neck pain. No visible trauma, no bleeding. No chest pain, dizziness, shortness of breath, dysuria, frequency, fever or chills. Back pain is aching, thoracic spine and occasionally sharp. No modifying factors.   Past Medical History  Diagnosis Date  . History of bladder cancer   . Dementia     With hallucinations/paranoia.  July 2013 CT brain: atrophy + small vessel ischemic demyelination  . Osteoarthritis of both knees   . Urinary incontinence   . History of non anemic vitamin B12 deficiency 08/2011 (level was 171)    Vit B12 level normal (466) 11/2011 at previous PCP.  Marland Kitchen History of hypothyroidism     currently off meds  . History of hypertension     Normotensive off meds as of 11/2011  . Chronic renal insufficiency, stage III (moderate)     Borderline stage II/III (Est CrCl about 60).    Past Surgical History  Procedure Date  . Hernia repair   . Shoulder surgery     Right  . Cholecystectomy 1980s    Family History  Problem Relation Age of Onset  . Congestive Heart Failure Mother     History  Substance Use Topics  . Smoking status: Never Smoker   . Smokeless tobacco: Never Used  . Alcohol Use: No      Review of Systems At least 10pt or greater review of systems completed and are negative except where specified in the HPI.  Allergies  Keflex  Home Medications    Current Outpatient Rx  Name  Route  Sig  Dispense  Refill  . ACETAMINOPHEN 500 MG PO CAPS      2 caps po tid   180 capsule   3   . CODEINE SULFATE 30 MG PO TABS   Oral   Take 1 tablet (30 mg total) by mouth every 6 (six) hours as needed for pain.   30 tablet   0   . DONEPEZIL HCL 5 MG PO TABS   Oral   Take 1 tablet (5 mg total) by mouth at bedtime.   30 tablet   1   . FOLIC ACID 1 MG PO TABS   Oral   Take 1 tablet (1 mg total) by mouth daily.   30 tablet   11   . SULFAMETHOXAZOLE-TRIMETHOPRIM 800-160 MG PO TABS   Oral   Take 1 tablet by mouth 2 (two) times daily.   28 tablet   0   . VITAMIN B-12 1000 MCG PO TABS   Oral   Take 1,000 mcg by mouth daily.           BP 147/68  Pulse 67  Temp 97.9 F (36.6 C) (Oral)  Resp 20  SpO2 100%  Physical Exam  Nursing notes reviewed.  Electronic medical record reviewed. VITAL SIGNS:   Filed Vitals:   07/05/12 1916 07/05/12 1938 07/05/12 2034  07/05/12 2105  BP: 194/77 190/73 180/74 183/84  Pulse: 71 68 70 73  Temp: 98 F (36.7 C)     TempSrc: Oral     Resp: 16 16 16 17   SpO2: 98% 100% 100% 98%   CONSTITUTIONAL: Awake, oriented, appears non-toxic HENT: Atraumatic, normocephalic, oral mucosa pink and moist, airway patent. Nares patent without drainage. External ears normal. EYES: Conjunctiva clear, EOMI, PERRLA NECK: Trachea midline, non-tender, supple CARDIOVASCULAR: Normal heart rate, Normal rhythm, No murmurs, rubs, gallops PULMONARY/CHEST: Clear to auscultation, no rhonchi, wheezes, or rales. Symmetrical breath sounds. Non-tender. ABDOMINAL: Non-distended, soft, non-tender - no rebound or guarding. Small proximal ventral hernia BS normal. NEUROLOGIC: Non-focal, moving all four extremities, no gross sensory or motor deficits. Reflexes 1+ bilaterally in the lower and upper extremities. EXTREMITIES: No clubbing, cyanosis, or edema. Chronically tender left knee. SKIN: Warm, Dry, No erythema, No rash  ED  Course  Procedures (including critical care time)  Date: 07/06/2012  Rate: 69  Rhythm: normal sinus rhythm  QRS Axis: normal  Intervals: normal  ST/T Wave abnormalities: normal  Conduction Disutrbances: none  Narrative Interpretation: unremarkable - nonischemic EKG, this EKG looks much improved from prior EKG dated 08/13/1998     Labs Reviewed  CBC WITH DIFFERENTIAL - Abnormal; Notable for the following:    RBC 3.78 (*)     Hemoglobin 12.1 (*)     HCT 36.1 (*)     Eosinophils Relative 7 (*)     All other components within normal limits  BASIC METABOLIC PANEL - Abnormal; Notable for the following:    BUN 27 (*)     Creatinine, Ser 1.67 (*)     GFR calc non Af Amer 35 (*)     GFR calc Af Amer 41 (*)     All other components within normal limits  URINALYSIS, ROUTINE W REFLEX MICROSCOPIC  TROPONIN I   Dg Lumbar Spine Complete  07/05/2012  *RADIOLOGY REPORT*  Clinical Data: Recent fall with low back pain  LUMBAR SPINE - COMPLETE 4+ VIEW  Comparison: Lumbar spine films of 06/27/2012  Findings: There is little change in mild lumbar scoliosis convex to the right and the bones are diffusely osteopenic.  A probable old partial compression deformity of the anterosuperior aspect of L4 appears stable.  No new compression deformity is seen. Degenerative change is again are noted in the facet joints particularly of L5-S1.  IMPRESSION:  1.  No change in alignment with mild curvature convex to the right, degenerative change in the facet joints of L5 S1, and diffuse osteopenia.  No acute compression deformity. 2.  Probable old mild compression of the anterior superior aspect of L4.   Original Report Authenticated By: Dwyane Dee, M.D.    Ct Head Wo Contrast  07/05/2012  *RADIOLOGY REPORT*  Clinical Data:  Fall.  Neck and head pain  CT HEAD WITHOUT CONTRAST CT CERVICAL SPINE WITHOUT CONTRAST  Technique:  Multidetector CT imaging of the head and cervical spine was performed following the standard  protocol without intravenous contrast.  Multiplanar CT image reconstructions of the cervical spine were also generated.  Comparison:  CT head 06/03/2012  CT HEAD  Findings: Generalized atrophy.  Negative for intracranial hemorrhage.  Negative for acute infarct or mass.  Chronic microvascular ischemic changes in the white matter.  Negative for skull fracture.  Chronic sinusitis is present.  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  IMPRESSION: No acute intracranial abnormality.  Chronic sinusitis.  CT CERVICAL SPINE  Findings: Negative for fracture.  Disc degeneration and spondylosis C3-C7.  Left-sided facet degeneration is present C3-C7.  Mild carotid artery calcification.  No mass or adenopathy in the neck.  IMPRESSION: Negative for fracture.  Moderate degenerative change.   Original Report Authenticated By: Janeece Riggers, M.D.    Ct Cervical Spine Wo Contrast  07/05/2012  *RADIOLOGY REPORT*  Clinical Data:  Fall.  Neck and head pain  CT HEAD WITHOUT CONTRAST CT CERVICAL SPINE WITHOUT CONTRAST  Technique:  Multidetector CT imaging of the head and cervical spine was performed following the standard protocol without intravenous contrast.  Multiplanar CT image reconstructions of the cervical spine were also generated.  Comparison:  CT head 06/03/2012  CT HEAD  Findings: Generalized atrophy.  Negative for intracranial hemorrhage.  Negative for acute infarct or mass.  Chronic microvascular ischemic changes in the white matter.  Negative for skull fracture.  Chronic sinusitis is present.  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  IMPRESSION: No acute intracranial abnormality.  Chronic sinusitis.  CT CERVICAL SPINE  Findings: Negative for fracture.  Disc degeneration and spondylosis C3-C7.  Left-sided facet degeneration is present C3-C7.  Mild carotid artery calcification.  No mass or adenopathy in the neck.  IMPRESSION: Negative for fracture.  Moderate degenerative change.   Original Report Authenticated  By: Janeece Riggers, M.D.    Ct Thoracic Spine Wo Contrast  07/05/2012  *RADIOLOGY REPORT*  Clinical Data: Fall.  Back pain  CT THORACIC SPINE WITHOUT CONTRAST  Technique:  Multidetector CT imaging of the thoracic spine was performed without intravenous contrast administration. Multiplanar CT image reconstructions were also generated  Comparison: None.  Findings: Normal thoracic alignment.  Negative for fracture. Thoracic osteophytes are present on the right in the mid thoracic spine.  There is accentuated thoracic kyphosis.  IMPRESSION: Negative for fracture.   Original Report Authenticated By: Janeece Riggers, M.D.    Dg Chest Portable 1 View  07/05/2012  *RADIOLOGY REPORT*  Clinical Data: Weakness, history dementia, hypertension, bladder cancer, chronic renal insufficiency  PORTABLE CHEST - 1 VIEW  Comparison: Portable exam 1627 hours compared to 12/04/2007  Findings: Enlargement of cardiac silhouette. Calcified tortuous aorta. Pulmonary vascularity normal. Elevation of right diaphragm. Mild bibasilar atelectasis. Questionable right upper lobe infiltrate. Minimal right pleural effusion. No pneumothorax. Advanced left glenohumeral degenerative changes and prior right shoulder joint replacement. Osseous demineralization.  IMPRESSION: Enlargement of cardiac silhouette. Questionable right upper lobe infiltrate. Bibasilar atelectasis and minimal right pleural effusion.   Original Report Authenticated By: Ulyses Southward, M.D.      1. Fall   2. Back pain       MDM  Joshua Gardner is a 78 y.o. male presenting with mechanical fall no signs of trauma to his head however he complains of neck pain secondary to fall.  CT scans of the head, neck and thoracic spine show no acute changes. No fractures. X-rays of lumbar spine show small old compression fracture. Patient's labs show some chronic renal insufficiency.   Patient is feeling better, Tylenol treated for pain. Patient wishes to go home we discharged home in  good condition.  I explained the diagnosis and have given explicit precautions to return to the ER including any other new or worsening symptoms. The patient understands and accepts the medical plan as it's been dictated and I have answered their questions. Discharge instructions concerning home care and prescriptions have been given.  The patient is STABLE and is discharged to home in good condition.  Jones Skene, MD 07/06/12 [phone]

## 2012-07-05 NOTE — ED Notes (Signed)
LKG:MW10<UV> Expected date:<BR> Expected time:<BR> Means of arrival:<BR> Comments:<BR> fall

## 2012-07-05 NOTE — ED Notes (Signed)
Pt here via EMS s/p fall tripped on w/c wheel trying to get up to bath hit his head no loc aox3 some dementia

## 2012-07-05 NOTE — ED Notes (Signed)
Patient unable to provide urine sample at this time

## 2012-07-06 ENCOUNTER — Encounter: Payer: Self-pay | Admitting: Family Medicine

## 2012-07-17 ENCOUNTER — Telehealth: Payer: Self-pay | Admitting: Family Medicine

## 2012-07-17 ENCOUNTER — Ambulatory Visit: Payer: Medicare Other | Admitting: Family Medicine

## 2012-07-17 NOTE — Telephone Encounter (Signed)
Pt daughter states that pt is falling a lot due to left knee pain.  He is unable to bear weight on that leg when transferring from bed to chair and chair to bed or using restroom.  Dois Davenport is wondering if a cortisone shot would help or if pt is candidate.  Wants to know if she needs to take pt to ortho to have this done. Please advise.

## 2012-07-17 NOTE — Telephone Encounter (Signed)
I think it would be best for the patient to go back to his orthopedist to address this problem.-thx

## 2012-07-17 NOTE — Telephone Encounter (Signed)
Dois Davenport notified and is agreeable.

## 2012-07-18 ENCOUNTER — Other Ambulatory Visit: Payer: Self-pay | Admitting: *Deleted

## 2012-07-18 NOTE — Telephone Encounter (Signed)
Needs 90 day supply of all meds to send to [place].  Nursing home is requiring pt to have filled through there pharmacy or they were [facility] of [facility]. To have filled through local pharmacy would incur an extra charge through nursing home to process.  Advised we will print new RX's and will let her know when RX's are ready.

## 2012-07-20 MED ORDER — LIDOCAINE 5 % EX PTCH: 1.0000 | MEDICATED_PATCH | CUTANEOUS | Status: DC

## 2012-07-20 NOTE — Telephone Encounter (Signed)
Pt daughter called to say that nursing home had suggested instead of appt with ortho, that maybe a pain patch to the area would be better.  Dois Davenport had old Lidocaine patches and placed one on the patient last night.  He has told home this morning that he is not having any pain.  Dois Davenport wants to know if pt can have an RX for these patches.  They would like to try prior to taking patient to ortho.

## 2012-07-20 NOTE — Telephone Encounter (Signed)
I did the order for the patches, but pre-warn the pt's daughter that insurance may throw up a roadblock b/c this med is very expensive.  We'll try it and see what happens.-thx

## 2012-07-21 ENCOUNTER — Other Ambulatory Visit: Payer: Self-pay | Admitting: Family Medicine

## 2012-07-21 MED ORDER — FOLIC ACID 1 MG PO TABS: 1.0000 mg | ORAL_TABLET | Freq: Every day | ORAL | Status: DC

## 2012-07-21 MED ORDER — VITAMIN B-12 1000 MCG PO TABS: 1000.0000 ug | ORAL_TABLET | Freq: Every day | ORAL | Status: DC

## 2012-07-21 MED ORDER — DONEPEZIL HCL 5 MG PO TABS: 5.0000 mg | ORAL_TABLET | Freq: Every day | ORAL | Status: DC

## 2012-07-21 MED ORDER — ACETAMINOPHEN 500 MG PO CAPS: 2.0000 | ORAL_CAPSULE | Freq: Three times a day (TID) | ORAL | Status: DC

## 2012-07-21 NOTE — Telephone Encounter (Signed)
Daughter notified lidoderm sent to pharmacy and other RX's will be ready later today to pick up.

## 2012-07-25 ENCOUNTER — Telehealth: Payer: Self-pay | Admitting: *Deleted

## 2012-07-25 MED ORDER — LIDOCAINE 5 % EX PTCH: 1.0000 | MEDICATED_PATCH | CUTANEOUS | Status: DC

## 2012-07-25 NOTE — Telephone Encounter (Signed)
PC from pt daughter regarding printed prescriptions for 90 day supply of med to [place].  Dois Davenport is now requesting printed RX for Lidoderm patches also.  She states they are working well.  Dois Davenport has been told by [facility] that this process could take 2 months to complete.  Would like to know if they can have RX now. Dois Davenport also stated they need each RX on separate sheet.  I will clarify if each RX needs to be on it's own full page or if RX's can be cut apart. Please advise Lidoderm refill.

## 2012-07-25 NOTE — Telephone Encounter (Signed)
Lidoderm rx printed.

## 2012-07-25 NOTE — Telephone Encounter (Signed)
Daughter notified lidoderm RX printed and others ready.

## 2012-07-27 MED ORDER — FOLIC ACID 1 MG PO TABS: 1.0000 mg | ORAL_TABLET | Freq: Every day | ORAL | Status: DC

## 2012-07-27 MED ORDER — ACETAMINOPHEN 500 MG PO CAPS: 2.0000 | ORAL_CAPSULE | Freq: Three times a day (TID) | ORAL | Status: DC

## 2012-07-27 MED ORDER — DONEPEZIL HCL 5 MG PO TABS: 5.0000 mg | ORAL_TABLET | Freq: Every day | ORAL | Status: DC

## 2012-07-27 MED ORDER — VITAMIN B-12 1000 MCG PO TABS: 1000.0000 ug | ORAL_TABLET | Freq: Every day | ORAL | Status: DC

## 2012-08-07 ENCOUNTER — Encounter: Payer: Self-pay | Admitting: *Deleted

## 2012-08-07 ENCOUNTER — Telehealth: Payer: Self-pay | Admitting: Family Medicine

## 2012-08-07 NOTE — Telephone Encounter (Signed)
Per Marchelle Folks at [facility], they will need that information written on a piece of paper with physician signature and faxed to [phone].  Letter written.

## 2012-08-07 NOTE — Telephone Encounter (Signed)
Joshua Gardner we can correct at [facility].  No answer/no option to leave message on automated attendant.  I will try again.

## 2012-08-07 NOTE — Telephone Encounter (Signed)
Letter faxed to [phone] Attn: Marchelle Folks

## 2012-08-07 NOTE — Telephone Encounter (Signed)
Please contact [facility] to let them know patient is not diabetic. They are charging extra charges for diabetic care.

## 2012-08-09 ENCOUNTER — Encounter: Payer: Self-pay | Admitting: Cardiology

## 2012-08-09 ENCOUNTER — Ambulatory Visit (INDEPENDENT_AMBULATORY_CARE_PROVIDER_SITE_OTHER): Payer: Medicare Other | Admitting: Cardiology

## 2012-08-09 ENCOUNTER — Telehealth: Payer: Self-pay | Admitting: Family Medicine

## 2012-08-09 VITALS — BP 136/74 | HR 80 | Ht 59.0 in | Wt 95.0 lb

## 2012-08-09 DIAGNOSIS — Z01818 Encounter for other preprocedural examination: Secondary | ICD-10-CM

## 2012-08-09 DIAGNOSIS — R9431 Abnormal electrocardiogram [ECG] [EKG]: Secondary | ICD-10-CM

## 2012-08-09 NOTE — Progress Notes (Signed)
Joshua Gardner Date of Birth: 06-19-1922 Medical Record #[id number]  History of Present Illness: Joshua Gardner is seen today at the request of Dr. Milinda Cave for preoperative clearance for circumcision. He is a pleasant 79 year old white male who has no prior cardiac history. He has been having difficulty with phimosis. He is being considered for circumcision. According to his daughter he will need general anesthesia. ECG was noted to be abnormal. Patient has no history of myocardial infarction or congestive heart failure. He has never had any arrhythmias. He denies any symptoms of chest pain, shortness of breath, or palpitations. His activity is very limited because of chronic knee pain. His only cardiac risk factor is history of mildly elevated cholesterol.  Current Outpatient Prescriptions on File Prior to Visit  Medication Sig Dispense Refill  . Acetaminophen 500 MG coapsule Take 2 capsules (1,000 mg total) by mouth 3 (three) times daily.  540 capsule  1  . codeine 30 MG tablet Take 1 tablet (30 mg total) by mouth every 6 (six) hours as needed for pain.  30 tablet  0  . donepezil (ARICEPT) 5 MG tablet Take 1 tablet (5 mg total) by mouth at bedtime.  90 tablet  1  . folic acid (FOLVITE) 1 MG tablet Take 1 tablet (1 mg total) by mouth daily.  90 tablet  3  . lidocaine (LIDODERM) 5 % Place 1 patch onto the skin daily. Apply one patch to affected area every 12 hours as needed for knee pains  180 patch  1  . vitamin B-12 (CYANOCOBALAMIN) 1000 MCG tablet Take 1 tablet (1,000 mcg total) by mouth daily.  90 tablet  3    Allergies  Allergen Reactions  . Keflex (Cephalexin)     Doesn't remember reaction    Past Medical History  Diagnosis Date  . History of bladder cancer   . Dementia     With hallucinations/paranoia.  July 2013 CT brain: atrophy + small vessel ischemic demyelination  . Osteoarthritis of both knees   . Urinary incontinence   . History of non anemic vitamin B12 deficiency 08/2011  (level was 171)    Vit B12 level normal (466) 11/2011 at previous PCP.  Marland Kitchen History of hypothyroidism     currently off meds  . History of hypertension     Normotensive off meds as of 11/2011  . Chronic renal insufficiency, stage III (moderate)     Borderline stage II/III (Est CrCl about 60).    Past Surgical History  Procedure Date  . Hernia repair   . Shoulder surgery     Right  . Cholecystectomy 1980s    History  Smoking status  . Never Smoker   Smokeless tobacco  . Never Used    History  Alcohol Use No    Family History  Problem Relation Age of Onset  . Congestive Heart Failure Mother     Review of Systems: The review of systems is positive for chronic arthralgias in his knees.  He has dementia. He is currently residing in assisted living at [facility]. All other systems were reviewed and are negative.  Physical Exam: BP 136/74  Pulse 80  Ht 4\' 11"  (1.499 m)  Wt 95 lb (43.092 kg)  BMI 19.19 kg/m2 He is a pleasant, elderly white male in no acute distress. He is seen in a wheelchair. HEENT: Balding, normocephalic/atraumatic. Pupils equal round and reactive. Oropharynx is clear. No thyromegaly, adenopathy, JVD, or bruits. Lungs: Clear Cardiovascular: Regular rate and rhythm,  normal S1 and S2, no gallop, murmur, or click. Abdomen: Soft and nontender Extremities: No cyanosis or edema. Pulses are palpable. Neuro: No focal findings. Cranial nerves II through XII are intact. Skin: Warm and dry. LABORATORY DATA: 2 ECGs were reviewed from December. He showed normal sinus rhythm with poor progression in precordial leads. There is otherwise no acute ST or T wave changes.  Assessment / Plan: 1. Abnormal ECG. He has poor R-wave progression but there is no evidence of prior myocardial infarction or ST T wave changes to suggest ischemia. Patient is completely asymptomatic. His cardiac exam is normal. I consider him low risk for surgery and if needed would proceed with  surgery without further cardiac evaluation.  2. Phimosis.  3. Osteoarthritis of the knee

## 2012-08-09 NOTE — Patient Instructions (Signed)
You are cleared for surgery from a cardiac standpoint.

## 2012-08-09 NOTE — Telephone Encounter (Signed)
Joshua Gardner would the letter emailed to her at [email or web address]

## 2012-08-09 NOTE — Telephone Encounter (Signed)
Dr. Milinda Cave, can you write this letter?  Thanks.

## 2012-08-14 ENCOUNTER — Telehealth: Payer: Self-pay | Admitting: Family Medicine

## 2012-08-14 ENCOUNTER — Encounter: Payer: Self-pay | Admitting: Family Medicine

## 2012-08-14 NOTE — Telephone Encounter (Signed)
Joshua Gardner notified we are working on letter and we will call her tomorrow when it is ready.

## 2012-09-15 ENCOUNTER — Telehealth: Payer: Self-pay | Admitting: Family Medicine

## 2012-09-15 NOTE — Telephone Encounter (Signed)
PC to Bjorn Loser, [employer] needs written order to "eval and treat for occupational and speech therapy".  This can be on a written script and they will send faxed order later.    Fax number [phone]

## 2012-09-15 NOTE — Telephone Encounter (Signed)
Home health nurse is requesting an order for home health nurse to open patient for occupational & speech therapy. If it's not done under home health it costs patient $45/visit.

## 2012-09-19 ENCOUNTER — Telehealth: Payer: Self-pay | Admitting: Family Medicine

## 2012-09-19 ENCOUNTER — Telehealth: Payer: Self-pay | Admitting: Internal Medicine

## 2012-09-19 NOTE — Telephone Encounter (Signed)
Thank you :)

## 2012-09-19 NOTE — Telephone Encounter (Signed)
FYI.  Received call from Amy (Call A Nurse).  Pt resides at [facility].  While auditing charts today a urine culture from 09/08/12 was reviewed.  Was positive for E. Coli.  Pt was never treated.  The reason for the initial urine check was confusion.  Pt is currently at his baseline mental status.  Afebrile.  120/72, pulse 72.  Eating and drinking well.  Asymptomatic.  Advised nurse that since asymptomatic, I would not treat at this time.  Monitor for any change and if any change or problems - call.

## 2012-09-19 NOTE — Telephone Encounter (Signed)
Solmon Ice, RN at facility calling to report a positive Urine Culture from 2/21.  Order placed for Confusion.  Results had not been reported prior to call.  Urine positive for e-coli.  Pt is reported by RN to be asymptomatic at this time.  Mental status is at baseline.  MD on call advised to continue to monitor pt for any changes.  No orders at this time.

## 2012-09-20 NOTE — Telephone Encounter (Signed)
Thayer Ohm at [facility] notified.

## 2012-09-20 NOTE — Telephone Encounter (Signed)
I agree that no further orders needed at this time. No treatment for his + urine culture at this time since he his at baseline mental status and no other symptoms to suggest urinary tract infection.  -thx

## 2012-09-20 NOTE — Telephone Encounter (Signed)
Pls call and give verbal ok to order speech therapy.-thx

## 2012-09-20 NOTE — Telephone Encounter (Signed)
Joshua Gardner [phone] called back. She states she can accept a verbal order for the patient to receive speech therapy. He has been having choking episodes while eating.

## 2012-09-21 NOTE — Telephone Encounter (Signed)
Rhonda notified.

## 2012-09-27 DIAGNOSIS — M19019 Primary osteoarthritis, unspecified shoulder: Secondary | ICD-10-CM

## 2012-09-27 DIAGNOSIS — Z5189 Encounter for other specified aftercare: Secondary | ICD-10-CM

## 2012-09-27 DIAGNOSIS — M1710 Unilateral primary osteoarthritis, unspecified knee: Secondary | ICD-10-CM

## 2012-09-27 DIAGNOSIS — F0390 Unspecified dementia without behavioral disturbance: Secondary | ICD-10-CM

## 2012-10-18 ENCOUNTER — Emergency Department (HOSPITAL_COMMUNITY): Payer: Medicare Other

## 2012-10-18 ENCOUNTER — Emergency Department (HOSPITAL_COMMUNITY)
Admission: EM | Admit: 2012-10-18 | Discharge: 2012-10-18 | Disposition: A | Discharge status: Home / Self Care | Payer: Medicare Other | Attending: Emergency Medicine | Admitting: Emergency Medicine

## 2012-10-18 ENCOUNTER — Encounter (HOSPITAL_COMMUNITY): Payer: Self-pay | Admitting: *Deleted

## 2012-10-18 DIAGNOSIS — Y9389 Activity, other specified: Secondary | ICD-10-CM | POA: Insufficient documentation

## 2012-10-18 DIAGNOSIS — S51819A Laceration without foreign body of unspecified forearm, initial encounter: Secondary | ICD-10-CM

## 2012-10-18 DIAGNOSIS — Z79899 Other long term (current) drug therapy: Secondary | ICD-10-CM | POA: Insufficient documentation

## 2012-10-18 DIAGNOSIS — N183 Chronic kidney disease, stage 3 unspecified: Secondary | ICD-10-CM | POA: Insufficient documentation

## 2012-10-18 DIAGNOSIS — N390 Urinary tract infection, site not specified: Secondary | ICD-10-CM | POA: Insufficient documentation

## 2012-10-18 DIAGNOSIS — W19XXXA Unspecified fall, initial encounter: Secondary | ICD-10-CM

## 2012-10-18 DIAGNOSIS — Z862 Personal history of diseases of the blood and blood-forming organs and certain disorders involving the immune mechanism: Secondary | ICD-10-CM | POA: Insufficient documentation

## 2012-10-18 DIAGNOSIS — Z8739 Personal history of other diseases of the musculoskeletal system and connective tissue: Secondary | ICD-10-CM | POA: Insufficient documentation

## 2012-10-18 DIAGNOSIS — W010XXA Fall on same level from slipping, tripping and stumbling without subsequent striking against object, initial encounter: Secondary | ICD-10-CM | POA: Insufficient documentation

## 2012-10-18 DIAGNOSIS — Z8551 Personal history of malignant neoplasm of bladder: Secondary | ICD-10-CM | POA: Insufficient documentation

## 2012-10-18 DIAGNOSIS — Z8639 Personal history of other endocrine, nutritional and metabolic disease: Secondary | ICD-10-CM | POA: Insufficient documentation

## 2012-10-18 DIAGNOSIS — T148XXA Other injury of unspecified body region, initial encounter: Secondary | ICD-10-CM | POA: Insufficient documentation

## 2012-10-18 DIAGNOSIS — I129 Hypertensive chronic kidney disease with stage 1 through stage 4 chronic kidney disease, or unspecified chronic kidney disease: Secondary | ICD-10-CM | POA: Insufficient documentation

## 2012-10-18 DIAGNOSIS — F0390 Unspecified dementia without behavioral disturbance: Secondary | ICD-10-CM | POA: Insufficient documentation

## 2012-10-18 DIAGNOSIS — Y9210 Unspecified residential institution as the place of occurrence of the external cause: Secondary | ICD-10-CM | POA: Insufficient documentation

## 2012-10-18 LAB — URINE MICROSCOPIC-ADD ON

## 2012-10-18 LAB — URINALYSIS, ROUTINE W REFLEX MICROSCOPIC
Ketones, ur: NEGATIVE mg/dL
Nitrite: POSITIVE — AB
Specific Gravity, Urine: 1.027 (ref 1.005–1.030)
Urobilinogen, UA: 0.2 mg/dL (ref 0.0–1.0)
pH: 6 (ref 5.0–8.0)

## 2012-10-18 LAB — CBC
Platelets: 193 10*3/uL (ref 150–400)
RBC: 3.57 MIL/uL — ABNORMAL LOW (ref 4.22–5.81)
WBC: 6.2 10*3/uL (ref 4.0–10.5)

## 2012-10-18 LAB — POCT I-STAT, CHEM 8
Chloride: 111 mEq/L (ref 96–112)
HCT: 36 % — ABNORMAL LOW (ref 39.0–52.0)
Hemoglobin: 12.2 g/dL — ABNORMAL LOW (ref 13.0–17.0)
Potassium: 5.1 mEq/L (ref 3.5–5.1)

## 2012-10-18 LAB — GLUCOSE, CAPILLARY: Glucose-Capillary: 98 mg/dL (ref 70–99)

## 2012-10-18 MED ORDER — SULFAMETHOXAZOLE-TRIMETHOPRIM 800-160 MG PO TABS: 1.0000 | ORAL_TABLET | Freq: Two times a day (BID) | ORAL | Status: DC

## 2012-10-18 NOTE — ED Notes (Signed)
Bed:WA23<BR> Expected date:<BR> Expected time:<BR> Means of arrival:<BR> Comments:<BR> ems

## 2012-10-18 NOTE — ED Provider Notes (Signed)
Joshua Gardner is a 79 y.o. male who fell in the bathroom, of his room, at the assisted living facility. He injured his head and both forearms. There is no known loss of consciousness. This is his third fall in the last 4 months. He has been seen, by physical therapy, at his facility. He has frequent UTIs. He saw urology, and consideration, of a circumcision to prevent UTIs; he and the family, are contemplating that procedure.  Exam alert, elderly man, who is conversant. Head, with mild abrasions, not bleeding. Neck supple. Extremities normal range of motion. He has superficial abrasions of both forearms.  Assessment: Fall with minor injuries, and UTI. Doubt significant head injury or fractures of the extremities. UTI is amenable to outpatient therapy with oral agent. Patient has moderate disability. He may need to be seen by physical therapy and his PCP to improve his functional status.  Flint Melter, MD 10/18/12 2027

## 2012-10-18 NOTE — ED Notes (Addendum)
Per ems pt is from [facility]. Pt wheelchair and walker for ambulation, pt was getting out of wheelchair to go to bathroom. Slipped and fell forwards, skin tear to back of left upper arm, skin tear to right forearm. Laceration to right cheek area and right frontal head area. Pt has no new pain, only chronic pain. Hx of dementia, Alert and oriented, staff reports pt is at his baseline mental status.

## 2012-10-18 NOTE — ED Provider Notes (Signed)
History     CSN: [id number]  Arrival date & time 10/18/12  1654   First MD Initiated Contact with Patient 10/18/12 1837      Chief Complaint  Patient presents with  . Fall    (Consider location/radiation/quality/duration/timing/severity/associated sxs/prior treatment) HPI Comments: 79 year old male, who presents emergency department with chief complaint of fall. Patient has past medical history remarkable for dementia. The patient is coming from [facility], where earlier this evening, he was getting out of his will chair to use the bathroom, when he slipped and fell forwards. He has a skin tear on his left upper arm, right forearm, and right cheek. He states that he is not in pain at this time, but that his c-collar is bothering him. He has not taken any blood thinners. He is otherwise healthy. He denies any loss of consciousness. Denies chest pain, shortness of breath, nausea, vomiting, diarrhea, constipation.  The history is provided by the patient. No language interpreter was used.    Past Medical History  Diagnosis Date  . History of bladder cancer   . Dementia     With hallucinations/paranoia.  July 2013 CT brain: atrophy + small vessel ischemic demyelination  . Osteoarthritis of both knees   . Urinary incontinence   . History of non anemic vitamin B12 deficiency 08/2011 (level was 171)    Vit B12 level normal (466) 11/2011 at previous PCP.  Marland Kitchen History of hypothyroidism     currently off meds  . History of hypertension     Normotensive off meds as of 11/2011  . Chronic renal insufficiency, stage III (moderate)     Borderline stage II/III (Est CrCl about 60).  . H/O phimosis 2012/2013    Urology recommended circumcision.  Cardiology cleared him for surgery 08/09/2012.    Past Surgical History  Procedure Laterality Date  . Hernia repair    . Shoulder surgery      Right  . Cholecystectomy  1980s    Family History  Problem Relation Age of Onset  . Congestive Heart  Failure Mother     History  Substance Use Topics  . Smoking status: Never Smoker   . Smokeless tobacco: Never Used  . Alcohol Use: No      Review of Systems  All other systems reviewed and are negative.    Allergies  Keflex  Home Medications   Current Outpatient Rx  Name  Route  Sig  Dispense  Refill  . acetaminophen (TYLENOL) 500 MG tablet   Oral   Take 1,000 mg by mouth 3 (three) times daily.         Marland Kitchen donepezil (ARICEPT) 5 MG tablet   Oral   Take 1 tablet (5 mg total) by mouth at bedtime.   90 tablet   1     Please dispense in blister packaging.   . folic acid (FOLVITE) 1 MG tablet   Oral   Take 1 tablet (1 mg total) by mouth daily.   90 tablet   3     Please dispense in blister packaging.   . lidocaine (LIDODERM) 5 %   Transdermal   Place 1 patch onto the skin daily as needed (for pain.). Remove & Discard patch within 12 hours or as directed by MD         . vitamin B-12 (CYANOCOBALAMIN) 1000 MCG tablet   Oral   Take 1 tablet (1,000 mcg total) by mouth daily.   90 tablet   3  Please dispense in blister packaging.     BP 160/65  Pulse 73  Temp(Src) 97.9 F (36.6 C) (Oral)  Resp 16  SpO2 100%  Physical Exam  Nursing note and vitals reviewed. Constitutional: He is oriented to person, place, and time. He appears well-developed and well-nourished.  In C-collar  HENT:  Head: Normocephalic and atraumatic.  Small laceration on right scalp, and right eyebrow  Eyes: Conjunctivae and EOM are normal. Pupils are equal, round, and reactive to light. Right eye exhibits no discharge. Left eye exhibits no discharge. No scleral icterus.  Neck: Normal range of motion. Neck supple. No JVD present.  Cardiovascular: Normal rate, regular rhythm, normal heart sounds and intact distal pulses.  Exam reveals no gallop and no friction rub.   No murmur heard. Pulmonary/Chest: Effort normal and breath sounds normal. No respiratory distress. He has no wheezes.  He has no rales. He exhibits no tenderness.  Abdominal: Soft. Bowel sounds are normal. He exhibits no distension and no mass. There is no tenderness. There is no rebound and no guarding.  Musculoskeletal: Normal range of motion. He exhibits no edema and no tenderness.  Neurological: He is alert and oriented to person, place, and time.  CN 3-12 intact  Skin: Skin is warm and dry.  Skin tear on left upper arm, proximally 4 x 4 centimeters,   Psychiatric: He has a normal mood and affect. His behavior is normal. Judgment and thought content normal.    ED Course  Procedures (including critical care time)  Labs Reviewed  CBC - Abnormal; Notable for the following:    RBC 3.57 (*)    Hemoglobin 12.2 (*)    HCT 36.1 (*)    MCV 101.1 (*)    MCH 34.2 (*)    All other components within normal limits  URINALYSIS, ROUTINE W REFLEX MICROSCOPIC - Abnormal; Notable for the following:    APPearance TURBID (*)    Hgb urine dipstick TRACE (*)    Nitrite POSITIVE (*)    Leukocytes, UA LARGE (*)    All other components within normal limits  URINE MICROSCOPIC-ADD ON - Abnormal; Notable for the following:    Bacteria, UA MANY (*)    Crystals CHOLESTEROL CRYSTALS PRESENT (*)    All other components within normal limits  POCT I-STAT, CHEM 8 - Abnormal; Notable for the following:    BUN 52 (*)    Creatinine, Ser 1.60 (*)    Calcium, Ion 1.12 (*)    Hemoglobin 12.2 (*)    HCT 36.0 (*)    All other components within normal limits  URINE CULTURE  GLUCOSE, CAPILLARY   Results for orders placed during the hospital encounter of 10/18/12  CBC      Result Value Range   WBC 6.2  4.0 - 10.5 K/uL   RBC 3.57 (*) 4.22 - 5.81 MIL/uL   Hemoglobin 12.2 (*) 13.0 - 17.0 g/dL   HCT 16.1 (*) 09.6 - 04.5 %   MCV 101.1 (*) 78.0 - 100.0 fL   MCH 34.2 (*) 26.0 - 34.0 pg   MCHC 33.8  30.0 - 36.0 g/dL   RDW 40.9  81.1 - 91.4 %   Platelets 193  150 - 400 K/uL  GLUCOSE, CAPILLARY      Result Value Range    Glucose-Capillary 98  70 - 99 mg/dL  URINALYSIS, ROUTINE W REFLEX MICROSCOPIC      Result Value Range   Color, Urine YELLOW  YELLOW   APPearance TURBID (*)  CLEAR   Specific Gravity, Urine 1.027  1.005 - 1.030   pH 6.0  5.0 - 8.0   Glucose, UA NEGATIVE  NEGATIVE mg/dL   Hgb urine dipstick TRACE (*) NEGATIVE   Bilirubin Urine NEGATIVE  NEGATIVE   Ketones, ur NEGATIVE  NEGATIVE mg/dL   Protein, ur NEGATIVE  NEGATIVE mg/dL   Urobilinogen, UA 0.2  0.0 - 1.0 mg/dL   Nitrite POSITIVE (*) NEGATIVE   Leukocytes, UA LARGE (*) NEGATIVE  URINE MICROSCOPIC-ADD ON      Result Value Range   WBC, UA 21-50  <3 WBC/hpf   RBC / HPF 3-6  <3 RBC/hpf   Bacteria, UA MANY (*) RARE   Crystals CHOLESTEROL CRYSTALS PRESENT (*) NEGATIVE  POCT I-STAT, CHEM 8      Result Value Range   Sodium 142  135 - 145 mEq/L   Potassium 5.1  3.5 - 5.1 mEq/L   Chloride 111  96 - 112 mEq/L   BUN 52 (*) 6 - 23 mg/dL   Creatinine, Ser 1.61 (*) 0.50 - 1.35 mg/dL   Glucose, Bld 99  70 - 99 mg/dL   Calcium, Ion 0.96 (*) 1.13 - 1.30 mmol/L   TCO2 24  0 - 100 mmol/L   Hemoglobin 12.2 (*) 13.0 - 17.0 g/dL   HCT 04.5 (*) 40.9 - 81.1 %   Ct Head Wo Contrast  10/18/2012  *RADIOLOGY REPORT*  Clinical Data:  Fall  CT HEAD WITHOUT CONTRAST CT CERVICAL SPINE WITHOUT CONTRAST  Technique:  Multidetector CT imaging of the head and cervical spine was performed following the standard protocol without intravenous contrast.  Multiplanar CT image reconstructions of the cervical spine were also generated.  Comparison:  07/05/2012  CT HEAD  Findings: Extensive global atrophy.  Chronic ischemic changes in the periventricular white matter, brain stem and left cerebellar white matter.  There is no mass effect, midline shift, or acute intracranial hemorrhage.  Chronic changes in the paranasal sinuses are stable.  Post-traumatic changes in the left maxillary sinus are also stable and chronic.  No definite acute fracture.  IMPRESSION: Chronic changes.  No  acute intracranial pathology.  CT CERVICAL SPINE  Findings: No acute fracture.  No dislocation.  Severe degenerative changes throughout the cervical spine are not significantly changed.  No obvious soft tissue injury.  IMPRESSION: No acute bony injury.  Degenerative changes.   Original Report Authenticated By: Jolaine Click, M.D.    Ct Cervical Spine Wo Contrast  10/18/2012  *RADIOLOGY REPORT*  Clinical Data:  Fall  CT HEAD WITHOUT CONTRAST CT CERVICAL SPINE WITHOUT CONTRAST  Technique:  Multidetector CT imaging of the head and cervical spine was performed following the standard protocol without intravenous contrast.  Multiplanar CT image reconstructions of the cervical spine were also generated.  Comparison:  07/05/2012  CT HEAD  Findings: Extensive global atrophy.  Chronic ischemic changes in the periventricular white matter, brain stem and left cerebellar white matter.  There is no mass effect, midline shift, or acute intracranial hemorrhage.  Chronic changes in the paranasal sinuses are stable.  Post-traumatic changes in the left maxillary sinus are also stable and chronic.  No definite acute fracture.  IMPRESSION: Chronic changes.  No acute intracranial pathology.  CT CERVICAL SPINE  Findings: No acute fracture.  No dislocation.  Severe degenerative changes throughout the cervical spine are not significantly changed.  No obvious soft tissue injury.  IMPRESSION: No acute bony injury.  Degenerative changes.   Original Report Authenticated By: Jolaine Click, M.D.  1. Fall, initial encounter   2. Skin tear of forearm without complication, unspecified laterality, initial encounter   3. UTI (lower urinary tract infection)       MDM  79 year old male, who fell earlier today while trying to use the bathroom. It was a mechanical fall. Patient presents in c-collar. Will order CT head and neck, will reevaluate.   8:19 PM Patient at baseline per daughter.  Patient seen by and discussed with Dr. Effie Shy.   Will provided wound care for skin tears.  Will treat UTI.  PCP follow up.  No fractures or acute findings on CT.  Patient is stable and ready for discharge.       Roxy Horseman, PA-C 10/18/12 2020

## 2012-10-19 NOTE — ED Provider Notes (Signed)
Medical screening examination/treatment/procedure(s) were conducted as a shared visit with non-physician practitioner(s) and myself.  I personally evaluated the patient during the encounter  Flint Melter, MD 10/19/12 [phone]

## 2012-10-20 LAB — URINE CULTURE

## 2012-10-21 ENCOUNTER — Telehealth (HOSPITAL_COMMUNITY): Payer: Self-pay | Admitting: Emergency Medicine

## 2012-10-21 NOTE — ED Notes (Signed)
+  Urine. Patient treated with Septra DS. Sensitive to same. Per protocol MD. °

## 2012-10-21 NOTE — ED Notes (Signed)
Patient has +Urine culture. Checking to see if appropriately treated. °

## 2012-11-11 ENCOUNTER — Emergency Department (HOSPITAL_COMMUNITY)
Admission: EM | Admit: 2012-11-11 | Discharge: 2012-11-11 | Disposition: A | Discharge status: Skilled Nursing Facility | Payer: Medicare Other | Attending: Emergency Medicine | Admitting: Emergency Medicine

## 2012-11-11 ENCOUNTER — Emergency Department (HOSPITAL_COMMUNITY): Payer: Medicare Other

## 2012-11-11 ENCOUNTER — Encounter (HOSPITAL_COMMUNITY): Payer: Self-pay | Admitting: *Deleted

## 2012-11-11 DIAGNOSIS — Z862 Personal history of diseases of the blood and blood-forming organs and certain disorders involving the immune mechanism: Secondary | ICD-10-CM | POA: Insufficient documentation

## 2012-11-11 DIAGNOSIS — Z79899 Other long term (current) drug therapy: Secondary | ICD-10-CM | POA: Insufficient documentation

## 2012-11-11 DIAGNOSIS — Z8639 Personal history of other endocrine, nutritional and metabolic disease: Secondary | ICD-10-CM | POA: Insufficient documentation

## 2012-11-11 DIAGNOSIS — Z87448 Personal history of other diseases of urinary system: Secondary | ICD-10-CM | POA: Insufficient documentation

## 2012-11-11 DIAGNOSIS — F0390 Unspecified dementia without behavioral disturbance: Secondary | ICD-10-CM | POA: Insufficient documentation

## 2012-11-11 DIAGNOSIS — Z8551 Personal history of malignant neoplasm of bladder: Secondary | ICD-10-CM | POA: Insufficient documentation

## 2012-11-11 DIAGNOSIS — I129 Hypertensive chronic kidney disease with stage 1 through stage 4 chronic kidney disease, or unspecified chronic kidney disease: Secondary | ICD-10-CM | POA: Insufficient documentation

## 2012-11-11 DIAGNOSIS — N183 Chronic kidney disease, stage 3 unspecified: Secondary | ICD-10-CM | POA: Insufficient documentation

## 2012-11-11 DIAGNOSIS — R4182 Altered mental status, unspecified: Secondary | ICD-10-CM | POA: Insufficient documentation

## 2012-11-11 DIAGNOSIS — Z8739 Personal history of other diseases of the musculoskeletal system and connective tissue: Secondary | ICD-10-CM | POA: Insufficient documentation

## 2012-11-11 LAB — COMPREHENSIVE METABOLIC PANEL
ALT: 12 U/L (ref 0–53)
AST: 17 U/L (ref 0–37)
Albumin: 2.8 g/dL — ABNORMAL LOW (ref 3.5–5.2)
CO2: 24 mEq/L (ref 19–32)
Chloride: 109 mEq/L (ref 96–112)
Creatinine, Ser: 1.1 mg/dL (ref 0.50–1.35)
GFR calc non Af Amer: 58 mL/min — ABNORMAL LOW (ref >90)
Potassium: 4.3 mEq/L (ref 3.5–5.1)
Sodium: 139 mEq/L (ref 135–145)
Total Bilirubin: 0.2 mg/dL — ABNORMAL LOW (ref 0.3–1.2)

## 2012-11-11 LAB — URINALYSIS, ROUTINE W REFLEX MICROSCOPIC
Bilirubin Urine: NEGATIVE
Glucose, UA: NEGATIVE mg/dL
Hgb urine dipstick: NEGATIVE
Protein, ur: NEGATIVE mg/dL
Urobilinogen, UA: 0.2 mg/dL (ref 0.0–1.0)

## 2012-11-11 LAB — CBC WITH DIFFERENTIAL/PLATELET
Basophils Absolute: 0 10*3/uL (ref 0.0–0.1)
Basophils Relative: 1 % (ref 0–1)
HCT: 40.9 % (ref 39.0–52.0)
Lymphocytes Relative: 21 % (ref 12–46)
MCHC: 33.7 g/dL (ref 30.0–36.0)
Monocytes Absolute: 0.5 10*3/uL (ref 0.1–1.0)
Neutro Abs: 2.8 10*3/uL (ref 1.7–7.7)
Neutrophils Relative %: 62 % (ref 43–77)
Platelets: 120 10*3/uL — ABNORMAL LOW (ref 150–400)
RDW: 12.9 % (ref 11.5–15.5)
WBC: 4.6 10*3/uL (ref 4.0–10.5)

## 2012-11-11 LAB — POCT I-STAT, CHEM 8
Calcium, Ion: 1.24 mmol/L (ref 1.13–1.30)
Chloride: 111 mEq/L (ref 96–112)
Glucose, Bld: 84 mg/dL (ref 70–99)
HCT: 33 % — ABNORMAL LOW (ref 39.0–52.0)
Hemoglobin: 11.2 g/dL — ABNORMAL LOW (ref 13.0–17.0)

## 2012-11-11 LAB — GLUCOSE, CAPILLARY: Glucose-Capillary: 77 mg/dL (ref 70–99)

## 2012-11-11 MED ORDER — SODIUM CHLORIDE 0.9 % IV BOLUS (SEPSIS)
500.0000 mL | Freq: Once | INTRAVENOUS | Status: AC
  Administered 2012-11-11: 500 mL via INTRAVENOUS

## 2012-11-11 NOTE — Discharge Instructions (Signed)
Altered Mental Status  Altered mental status most often refers to an abnormal change in your responsiveness and awareness. It can affect your speech, thought, mobility, memory, attention span, or alertness. It can range from slight confusion to complete unresponsiveness (coma). Altered mental status can be a sign of a serious underlying medical condition. Rapid evaluation and medical treatment is necessary for patients having an altered mental status.  CAUSES    Low blood sugar (hypoglycemia) or diabetes.   Severe loss of body fluids (dehydration) or a body salt (electrolyte) imbalance.   A stroke or other neurologic problem, such as dementia or delirium.   A head injury or tumor.   A drug or alcohol overdose.   Exposure to toxins or poisons.   Depression, anxiety, and stress.   A low oxygen level (hypoxia).   An infection.   Blood loss.   Twitching or shaking (seizure).   Heart problems, such as heart attack or heart rhythm problems (arrhythmias).   A body temperature that is too low or too high (hypothermia or hyperthermia).  DIAGNOSIS   A diagnosis is based on your history, symptoms, physical and neurologic examinations, and diagnostic tests. Diagnostic tests may include:   Measurement of your blood pressure, pulse, breathing, and oxygen levels (vital signs).   Blood tests.   Urine tests.   X-ray exams.   A computerized magnetic scan (magnetic resonance imaging, MRI).   A computerized X-ray scan (computed tomography, CT scan).  TREATMENT   Treatment will depend on the cause. Treatment may include:   Management of an underlying medical or mental health condition.   Critical care or support in the hospital.  HOME CARE INSTRUCTIONS    Only take over-the-counter or prescription medicines for pain, discomfort, or fever as directed by your caregiver.   Manage underlying conditions as directed by your caregiver.   Eat a healthy, well-balanced diet to maintain strength.   Join a support group or  prevention program to cope with the condition or trauma that caused the altered mental status. Ask your caregiver to help choose a program that works for you.   Follow up with your caregiver for further examination, therapy, or testing as directed.  SEEK MEDICAL CARE IF:    You feel unwell or have chills.   You or your family notice a change in your behavior or your alertness.   You have trouble following your caregiver's treatment plan.   You have questions or concerns.  SEEK IMMEDIATE MEDICAL CARE IF:    You have a rapid heartbeat or have chest pain.   You have difficulty breathing.   You have a fever.   You have a headache with a stiff neck.   You cough up blood.   You have blood in your urine or stool.   You have severe agitation or confusion.  MAKE SURE YOU:    Understand these instructions.   Will watch your condition.   Will get help right away if you are not doing well or get worse.  Document Released: 12/23/2009 Document Revised: 09/27/2011 Document Reviewed: 12/23/2009  Exit[employer] Patient Information 2013 [employer].

## 2012-11-11 NOTE — ED Notes (Signed)
GEX:BMWU<XL> Expected date:11/11/12<BR> Expected time: 1:12 PM<BR> Means of arrival:Ambulance<BR> Comments:<BR> unresponsive

## 2012-11-11 NOTE — ED Notes (Signed)
Pts daughter at bedside  

## 2012-11-11 NOTE — ED Notes (Signed)
Per ems: pt from [facility], was found unresponsive today by staff. Pt responds to sternal rub. Pupils equal/reacrtive. Hx of frequent UTIs. 20 gauge left AC. bp 150/62, pulse 64, occasional PVCs

## 2012-11-11 NOTE — ED Provider Notes (Signed)
History     CSN: [id number]  Arrival date & time 11/11/12  1317   First MD Initiated Contact with Patient 11/11/12 1338      No chief complaint on file.   (Consider location/radiation/quality/duration/timing/severity/associated sxs/prior treatment) HPI Comments: [facility] of being found unresponsive this afternoon.  He seems to have improved en route.  He has a history of dementia and adds little history.  A Level 5 caveat applies.  He denies any pain or other complaints.    The history is provided by the patient.    Past Medical History  Diagnosis Date  . History of bladder cancer   . Dementia     With hallucinations/paranoia.  July 2013 CT brain: atrophy + small vessel ischemic demyelination  . Osteoarthritis of both knees   . Urinary incontinence   . History of non anemic vitamin B12 deficiency 08/2011 (level was 171)    Vit B12 level normal (466) 11/2011 at previous PCP.  Marland Kitchen History of hypothyroidism     currently off meds  . History of hypertension     Normotensive off meds as of 11/2011  . Chronic renal insufficiency, stage III (moderate)     Borderline stage II/III (Est CrCl about 60).  . H/O phimosis 2012/2013    Urology recommended circumcision.  Cardiology cleared him for surgery 08/09/2012.    Past Surgical History  Procedure Laterality Date  . Hernia repair    . Shoulder surgery      Right  . Cholecystectomy  1980s    Family History  Problem Relation Age of Onset  . Congestive Heart Failure Mother     History  Substance Use Topics  . Smoking status: Never Smoker   . Smokeless tobacco: Never Used  . Alcohol Use: No      Review of Systems  All other systems reviewed and are negative.    Allergies  Keflex  Home Medications   Current Outpatient Rx  Name  Route  Sig  Dispense  Refill  . acetaminophen (TYLENOL) 500 MG tablet   Oral   Take 1,000 mg by mouth 3 (three) times daily.         Marland Kitchen donepezil (ARICEPT) 5 MG  tablet   Oral   Take 1 tablet (5 mg total) by mouth at bedtime.   90 tablet   1     Please dispense in blister packaging.   Clinical research associate Bandages & Supports (ANTI-EMBOLISM STOCKINGS MEDIUM) MISC   Does not apply   1 Package by Does not apply route daily. Apply in the am and remove at 8pm         . folic acid (FOLVITE) 1 MG tablet   Oral   Take 1 tablet (1 mg total) by mouth daily.   90 tablet   3     Please dispense in blister packaging.   . lidocaine (LIDODERM) 5 %   Transdermal   Place 1 patch onto the skin daily as needed (for pain.). Remove & Discard patch within 12 hours or as directed by MD         . vitamin B-12 (CYANOCOBALAMIN) 1000 MCG tablet   Oral   Take 1 tablet (1,000 mcg total) by mouth daily.   90 tablet   3     Please dispense in blister packaging.     BP 166/75  Pulse 61  Temp(Src) 98.5 F (36.9 C) (Rectal)  Resp 16  SpO2 99%  Physical  Exam  Nursing note and vitals reviewed. Constitutional: He appears well-developed.  Elderly male in no distress.  He appears to be resting comfortably.  HENT:  Head: Atraumatic.  Mouth/Throat: Oropharynx is clear and moist.  Eyes: EOM are normal. Pupils are equal, round, and reactive to light.  Neck: Normal range of motion. Neck supple.  Cardiovascular: Normal rate and regular rhythm.   No murmur heard. Pulmonary/Chest: Effort normal and breath sounds normal. No respiratory distress.  Abdominal: Soft. Bowel sounds are normal. He exhibits no distension. There is no tenderness.  Musculoskeletal: Normal range of motion. He exhibits no edema.  Lymphadenopathy:    He has no cervical adenopathy.  Neurological:  The patient is somnolent but arouseable.  He is able to speak softly and the content of his speech appears appropriate.  He follows commands and moves all four extremities.  Skin: Skin is warm and dry.    ED Course  Procedures (including critical care time)  Labs Reviewed  POCT I-STAT, CHEM 8 -  Abnormal; Notable for the following:    BUN 28 (*)    Hemoglobin 11.2 (*)    HCT 33.0 (*)    All other components within normal limits  CBC WITH DIFFERENTIAL  COMPREHENSIVE METABOLIC PANEL   No results found.   No diagnosis found.   Date: 11/11/2012  Rate: 57  Rhythm: sinus bradycardia  QRS Axis: normal  Intervals: normal  ST/T Wave abnormalities: nonspecific T wave changes  Conduction Disutrbances:none  Narrative Interpretation:   Old EKG Reviewed: unchanged    MDM  The patient presents with somnolence, decreased loc at the nursing home.  He is somnolent, but otherwise appears well, is arouseable and appropriate.  The labs and ct scan are unremarkable.  I have found definite cause for this, however nothing appears emergent and he appears stable for discharge to home.        Geoffery Lyons, MD 11/11/12 [phone]

## 2012-11-29 ENCOUNTER — Ambulatory Visit (INDEPENDENT_AMBULATORY_CARE_PROVIDER_SITE_OTHER): Payer: Medicare Other | Admitting: Family Medicine

## 2012-11-29 ENCOUNTER — Encounter: Payer: Self-pay | Admitting: Family Medicine

## 2012-11-29 VITALS — BP 150/83 | HR 72 | Temp 97.9°F | Resp 14

## 2012-11-29 DIAGNOSIS — H04129 Dry eye syndrome of unspecified lacrimal gland: Secondary | ICD-10-CM

## 2012-11-29 DIAGNOSIS — H04123 Dry eye syndrome of bilateral lacrimal glands: Secondary | ICD-10-CM

## 2012-11-29 DIAGNOSIS — F0390 Unspecified dementia without behavioral disturbance: Secondary | ICD-10-CM

## 2012-11-29 DIAGNOSIS — N3941 Urge incontinence: Secondary | ICD-10-CM

## 2012-11-29 NOTE — Assessment & Plan Note (Signed)
Systane Balance Lubricant eye drops, 2 drops in each eye tid.

## 2012-11-29 NOTE — Progress Notes (Signed)
OFFICE NOTE  11/29/2012  CC:  Chief Complaint  Patient presents with  . Dry Eye    Pt c/o dry eyes, bilateral & request Physician Order for drops TID for [facility]  . Urinary Retention    Pt c/o trouble emptying bladder & urinary leakage     HPI: [name] who is here for discussion of eye symptoms, f/u urinary problems and dementia. C/o dry eyes, has been a long term symptom and he needs an order so that his daughter can dispense lubricant eye drops at his assisted living facility.   Also c/o some urinary urgency that bothers him some and leads to some leakage/incontinence.  No dysuria, no foreskin swelling, no abd pain or fevers.  He feels no sense of incomplete emptying.  No urinary frequency.   His daughter is concerned b/c he does not like water and does not drink it much during daytime when offered.    He is getting PT but per daughter they are not being very aggressive.  He is wheelchair bound and can barely transfer from his chair to the toilet or to bed.    Daughter feels like his memory problems/dementia is unchanged lately.  No periods of combativeness or hallucinations.  His daughter has some questions about advanced directives.  Apparently he was taken to the hospital a few weeks ago for being unresponsive.  ED eval was done and he turned out fine: CT brain showed no acute dz, labs were normal, urine normal. Daughter feels like he was taken prematurely and she wishes she would have been called to help decide whether to take him to the hospital or not.   Pertinent PMH:  Past Medical History  Diagnosis Date  . History of bladder cancer   . Dementia     With hallucinations/paranoia.  July 2013 CT brain: atrophy + small vessel ischemic demyelination  . Osteoarthritis of both knees   . Urinary incontinence   . History of non anemic vitamin B12 deficiency 08/2011 (level was 171)    Vit B12 level normal (466) 11/2011 at previous PCP.  Marland Kitchen  History of hypothyroidism     currently off meds  . History of hypertension     Normotensive off meds as of 11/2011  . Chronic renal insufficiency, stage III (moderate)     Borderline stage II/III (Est CrCl about 60).  . H/O phimosis 2012/2013    Urology recommended circumcision.  Cardiology cleared him for surgery 08/09/2012.   Past surgical, social, and family history reviewed and no changes noted since last office visit.  MEDS:  Outpatient Prescriptions Prior to Visit  Medication Sig Dispense Refill  . acetaminophen (TYLENOL) 500 MG tablet Take 1,000 mg by mouth 3 (three) times daily.      Marland Kitchen donepezil (ARICEPT) 5 MG tablet Take 1 tablet (5 mg total) by mouth at bedtime.  90 tablet  1  . Elastic Bandages & Supports (ANTI-EMBOLISM STOCKINGS MEDIUM) MISC 1 Package by Does not apply route daily. Apply in the am and remove at 8pm      . folic acid (FOLVITE) 1 MG tablet Take 1 tablet (1 mg total) by mouth daily.  90 tablet  3  . lidocaine (LIDODERM) 5 % Place 1 patch onto the skin daily as needed (for pain.). Remove & Discard patch within 12 hours or as directed by MD      . vitamin B-12 (CYANOCOBALAMIN) 1000 MCG tablet Take 1 tablet (1,000 mcg  total) by mouth daily.  90 tablet  3   No facility-administered medications prior to visit.    PE: Blood pressure 150/83, pulse 72, temperature 97.9 F (36.6 C), temperature source Oral, resp. rate 14, weight 0 lb (0 kg), SpO2 98.00%. Gen: Alert, well appearing.  Patient is oriented to person, place, and situation. ENT: conjunctiva appears diffusely irritated, dry.  No exudate or swelling. CV: RRR, no m/r/g LUNGS: bibasilar soft insp crackles, otherwise clear.  Nonlabored resps. GU: uncircumcised, foreskin is not swollen.  I was able to retract the foreskin completely to expose the glans and it appeared normal, meatus normal.    IMPRESSION AND PLAN:  Dementia Stable.  Continue aricept. Encouraged pt to drink water more when offered and also  encouraged him to request water more. He is severely deconditioned and hopefully PT will get more aggressive with getting him moving or at least able to transfer better.  Dry eyes Systane Balance Lubricant eye drops, 2 drops in each eye tid.  Urge incontinence No intervention/med at this time.  He seems to tolerate this ok and I think the potential side effects of a med like ditropan would be significant in an elderly, demented man.   Advanced directives/health care POA discussion.  Gave pt and daughter a copy of the [facility] information documents on these topics and encouraged them to review them and make their wishes clear.  As it stands right now, Mr. Groninger has no [facility] POA and he is making his own decisions regarding his care.  Spent 30 min with pt today, with >50% of this time spent in counseling and care coordination regarding the above problems.  FOLLOW UP: 3 mo

## 2012-11-29 NOTE — Assessment & Plan Note (Signed)
No intervention/med at this time.  He seems to tolerate this ok and I think the potential side effects of a med like ditropan would be significant in an elderly, demented man.

## 2012-11-29 NOTE — Assessment & Plan Note (Signed)
Stable.  Continue aricept. Encouraged pt to drink water more when offered and also encouraged him to request water more. He is severely deconditioned and hopefully PT will get more aggressive with getting him moving or at least able to transfer better.

## 2012-11-30 ENCOUNTER — Ambulatory Visit: Payer: Medicare Other | Admitting: Family Medicine

## 2012-12-28 ENCOUNTER — Telehealth: Payer: Self-pay | Admitting: *Deleted

## 2012-12-28 NOTE — Telephone Encounter (Signed)
This is fine with me.  thx

## 2012-12-28 NOTE — Telephone Encounter (Signed)
Nedra Hai, PT w/[facility] called RE: Plan of Care for pt, including 3 wks x2 & 2 wks x2 of therapy for Gait Transfer, & Strengthening of LEs/SLS Please advise.

## 2012-12-29 NOTE — Telephone Encounter (Signed)
Caller informed of verbal authorization per PHM/SLS

## 2013-01-10 ENCOUNTER — Emergency Department (HOSPITAL_COMMUNITY): Payer: Medicare Other

## 2013-01-10 ENCOUNTER — Emergency Department (HOSPITAL_COMMUNITY)
Admission: EM | Admit: 2013-01-10 | Discharge: 2013-01-10 | Disposition: A | Discharge status: Home / Self Care | Payer: Medicare Other | Attending: Emergency Medicine | Admitting: Emergency Medicine

## 2013-01-10 DIAGNOSIS — Z79899 Other long term (current) drug therapy: Secondary | ICD-10-CM | POA: Insufficient documentation

## 2013-01-10 DIAGNOSIS — Z993 Dependence on wheelchair: Secondary | ICD-10-CM | POA: Insufficient documentation

## 2013-01-10 DIAGNOSIS — R400 Somnolence: Secondary | ICD-10-CM

## 2013-01-10 DIAGNOSIS — R4182 Altered mental status, unspecified: Secondary | ICD-10-CM

## 2013-01-10 DIAGNOSIS — R404 Transient alteration of awareness: Secondary | ICD-10-CM | POA: Insufficient documentation

## 2013-01-10 DIAGNOSIS — M1710 Unilateral primary osteoarthritis, unspecified knee: Secondary | ICD-10-CM | POA: Insufficient documentation

## 2013-01-10 DIAGNOSIS — Z862 Personal history of diseases of the blood and blood-forming organs and certain disorders involving the immune mechanism: Secondary | ICD-10-CM | POA: Insufficient documentation

## 2013-01-10 DIAGNOSIS — IMO0002 Reserved for concepts with insufficient information to code with codable children: Secondary | ICD-10-CM | POA: Insufficient documentation

## 2013-01-10 DIAGNOSIS — Z87448 Personal history of other diseases of urinary system: Secondary | ICD-10-CM | POA: Insufficient documentation

## 2013-01-10 DIAGNOSIS — Z8639 Personal history of other endocrine, nutritional and metabolic disease: Secondary | ICD-10-CM | POA: Insufficient documentation

## 2013-01-10 DIAGNOSIS — F0390 Unspecified dementia without behavioral disturbance: Secondary | ICD-10-CM | POA: Insufficient documentation

## 2013-01-10 DIAGNOSIS — Z8551 Personal history of malignant neoplasm of bladder: Secondary | ICD-10-CM | POA: Insufficient documentation

## 2013-01-10 DIAGNOSIS — N183 Chronic kidney disease, stage 3 unspecified: Secondary | ICD-10-CM | POA: Insufficient documentation

## 2013-01-10 DIAGNOSIS — I129 Hypertensive chronic kidney disease with stage 1 through stage 4 chronic kidney disease, or unspecified chronic kidney disease: Secondary | ICD-10-CM | POA: Insufficient documentation

## 2013-01-10 LAB — CBC
HCT: 36.4 % — ABNORMAL LOW (ref 39.0–52.0)
MCHC: 33 g/dL (ref 30.0–36.0)
MCV: 102.2 fL — ABNORMAL HIGH (ref 78.0–100.0)
RDW: 13.2 % (ref 11.5–15.5)

## 2013-01-10 LAB — RAPID URINE DRUG SCREEN, HOSP PERFORMED
Opiates: NOT DETECTED
Tetrahydrocannabinol: NOT DETECTED

## 2013-01-10 LAB — COMPREHENSIVE METABOLIC PANEL
Albumin: 3.3 g/dL — ABNORMAL LOW (ref 3.5–5.2)
BUN: 38 mg/dL — ABNORMAL HIGH (ref 6–23)
Creatinine, Ser: 1.47 mg/dL — ABNORMAL HIGH (ref 0.50–1.35)
Potassium: 4.6 mEq/L (ref 3.5–5.1)
Total Protein: 5.9 g/dL — ABNORMAL LOW (ref 6.0–8.3)

## 2013-01-10 LAB — URINALYSIS, ROUTINE W REFLEX MICROSCOPIC
Glucose, UA: NEGATIVE mg/dL
Leukocytes, UA: NEGATIVE
pH: 5 (ref 5.0–8.0)

## 2013-01-10 LAB — POCT I-STAT TROPONIN I

## 2013-01-10 LAB — GLUCOSE, CAPILLARY: Glucose-Capillary: 92 mg/dL (ref 70–99)

## 2013-01-10 NOTE — ED Provider Notes (Signed)
History    CSN: [id number] Arrival date & time 01/10/13  1525  First MD Initiated Contact with Patient 01/10/13 1537     Chief Complaint  Patient presents with  . Altered Mental Status   HPI Pt is from [facility] and uses a wheelchair for mobility. Pt has been seemingly acting weaker x 1 hour. Alert to verbal stimuli.    Past Medical History  Diagnosis Date  . History of bladder cancer   . Dementia     With hallucinations/paranoia.  July 2013 CT brain: atrophy + small vessel ischemic demyelination  . Osteoarthritis of both knees   . Urinary incontinence   . History of non anemic vitamin B12 deficiency 08/2011 (level was 171)    Vit B12 level normal (466) 11/2011 at previous PCP.  Marland Kitchen History of hypothyroidism     currently off meds  . History of hypertension     Normotensive off meds as of 11/2011  . Chronic renal insufficiency, stage III (moderate)     Borderline stage II/III (Est CrCl about 60).  . H/O phimosis 2012/2013    Urology recommended circumcision.  Cardiology cleared him for surgery 08/09/2012.   Past Surgical History  Procedure Laterality Date  . Hernia repair    . Shoulder surgery      Right  . Cholecystectomy  1980s   Family History  Problem Relation Age of Onset  . Congestive Heart Failure Mother    History  Substance Use Topics  . Smoking status: Never Smoker   . Smokeless tobacco: Never Used  . Alcohol Use: No    Review of Systems  Unable to perform ROS: Dementia    Allergies  Keflex  Home Medications   Current Outpatient Rx  Name  Route  Sig  Dispense  Refill  . acetaminophen (TYLENOL) 500 MG tablet   Oral   Take 1,000 mg by mouth 3 (three) times daily.         Marland Kitchen donepezil (ARICEPT) 5 MG tablet   Oral   Take 1 tablet (5 mg total) by mouth at bedtime.   90 tablet   1     Please dispense in blister packaging.   . folic acid (FOLVITE) 1 MG tablet   Oral   Take 1 tablet (1 mg total) by mouth daily.   90 tablet   3    Please dispense in blister packaging.   . lidocaine (LIDODERM) 5 %   Transdermal   Place 1 patch onto the skin daily as needed (for pain.). Remove & Discard patch within 12 hours or as directed by MD         . Polyethyl Glycol-Propyl Glycol (SYSTANE OP)   Both Eyes   Place 2 drops into both eyes 3 (three) times daily.         . vitamin B-12 (CYANOCOBALAMIN) 1000 MCG tablet   Oral   Take 1 tablet (1,000 mcg total) by mouth daily.   90 tablet   3     Please dispense in blister packaging.   Clinical research associate Bandages & Supports (ANTI-EMBOLISM STOCKINGS MEDIUM) MISC   Does not apply   1 Package by Does not apply route daily. Apply in the am and remove at 8pm          BP 181/88  Pulse 65  Temp(Src) 98.5 F (36.9 C) (Rectal)  Resp 20  SpO2 100% Physical Exam  Nursing note and vitals reviewed. Constitutional: He appears well-developed.  Elderly male  in no distress.  He appears to be resting comfortably.  HENT:  Head: Atraumatic.  Mouth/Throat: Oropharynx is clear and moist.  Eyes: EOM are normal. Pupils are equal, round, and reactive to light.  Neck: Normal range of motion. Neck supple.  Cardiovascular: Normal rate and regular rhythm.   No murmur heard. Pulmonary/Chest: Effort normal and breath sounds normal. No respiratory distress.  Abdominal: Soft. Bowel sounds are normal. He exhibits no distension. There is no tenderness.  Musculoskeletal: Normal range of motion. He exhibits no edema.  Lymphadenopathy:    He has no cervical adenopathy.  Neurological:  The patient is somnolent but arouseable.  He is able to speak softly and the content of his speech appears appropriate.  He follows commands and moves all four extremities.  Skin: Skin is warm and dry.   he has no lateralizing weakness.  His cranial nerves appeared be grossly intact with no facial weakness.  ED Course  Procedures (including critical care time) Labs Reviewed  CBC - Abnormal; Notable for the following:     RBC 3.56 (*)    Hemoglobin 12.0 (*)    HCT 36.4 (*)    MCV 102.2 (*)    Platelets 143 (*)    All other components within normal limits  COMPREHENSIVE METABOLIC PANEL - Abnormal; Notable for the following:    Glucose, Bld 104 (*)    BUN 38 (*)    Creatinine, Ser 1.47 (*)    Total Protein 5.9 (*)    Albumin 3.3 (*)    GFR calc non Af Amer 41 (*)    GFR calc Af Amer 47 (*)    All other components within normal limits  URINALYSIS, ROUTINE W REFLEX MICROSCOPIC  GLUCOSE, CAPILLARY  URINE RAPID DRUG SCREEN (HOSP PERFORMED)  POCT I-STAT TROPONIN I   No results found. 1. Altered mental status   2. Intermittent drowsiness     MDM  Daughter came to ED.  He is sleepy,, but may have not slept well.  Neg drug screen.  Patient appears stable for discharge.    Nelia Shi, MD 01/18/13 2037

## 2013-01-10 NOTE — ED Notes (Addendum)
Pt is from [facility] and uses a wheelchair for mobility. Pt has been seemingly acting weaker x 1 hour. Alert to verbal stimuli.

## 2013-01-10 NOTE — ED Notes (Signed)
[id number]<AV> Expected date:<BR> Expected time:<BR> Means of arrival:<BR> Comments:<BR> ALOC

## 2013-01-12 ENCOUNTER — Telehealth: Payer: Self-pay | Admitting: *Deleted

## 2013-01-12 NOTE — Telephone Encounter (Signed)
Therapist from [facility] left vm requesting verbal order for occupational therapist. To improve ADL's, upper limb strength, and maintain upper limb range. Please advise?

## 2013-01-13 NOTE — Telephone Encounter (Signed)
Ok to give verbal order for this.-thx

## 2013-01-15 NOTE — Telephone Encounter (Signed)
Therapist notified of verbal order. The therapist stated that the orders were for 10 visits working on safety transfers and ADL's.

## 2013-01-17 ENCOUNTER — Telehealth: Payer: Self-pay | Admitting: Family Medicine

## 2013-01-17 NOTE — Telephone Encounter (Signed)
They were ordered 06/13/12.

## 2013-01-17 NOTE — Telephone Encounter (Signed)
Patient was charged for a pair of TED hose. The bill was for a large amount. Patient's daughter said that patient already had compression hose when he went in to the facility & that he never got them anyway. The facility is telling her that Dr. Milinda Cave ordered them so that is why they got them for the patient. They will not remove the charge. The patient's daughter wants to know if we can help her with getting this bill removed.

## 2013-01-22 ENCOUNTER — Telehealth: Payer: Self-pay | Admitting: Family Medicine

## 2013-01-22 NOTE — Telephone Encounter (Signed)
Malory, Occupational therapist at [facility] called this am stating patients BP was at 82/54.  She just wanted [place] to be aware.

## 2013-01-22 NOTE — Telephone Encounter (Signed)
Pls call this patient's daughter (he has dementia) and ask her if she can give a general update on him, particularly in regard to whether or not he is eating and drinking.  Also, any physical complaints (esp any NEW ones).  Tell her I'm asking b/c his bp is usually a little on the high side here but his OT from [facility] called and reported his bp was 82/54.  She just wanted [place] to be aware and she didn't say he was ill or anything.

## 2013-01-22 NOTE — Telephone Encounter (Signed)
Pt's daughter states he is not drinking water like he should be, possible dehydration.  Patient will call us back if she believes he needs to be seen.  During day call: [phone] After 4:30 call: [phone]

## 2013-01-23 ENCOUNTER — Telehealth: Payer: Self-pay | Admitting: Family Medicine

## 2013-01-23 NOTE — Telephone Encounter (Signed)
Patient still has a lot of nasal drainage that is clear. He is coughing a lot. Patient's daughter is requesting an order to be sent to the nursing home for her to bring in an OTC medication that you recommend to give him.  She does not want the nursing home to order the medication through their pharmacy due to the cost. She will supply the medication.

## 2013-01-23 NOTE — Telephone Encounter (Signed)
Noted  

## 2013-01-23 NOTE — Telephone Encounter (Signed)
Pts daughter, Dois Davenport called and said she visited her dad this am and he was doing well.  She doesn't think it's necessary for any kind of office visit.

## 2013-01-24 ENCOUNTER — Ambulatory Visit (INDEPENDENT_AMBULATORY_CARE_PROVIDER_SITE_OTHER): Payer: Medicare Other | Admitting: Family Medicine

## 2013-01-24 ENCOUNTER — Encounter: Payer: Self-pay | Admitting: Family Medicine

## 2013-01-24 VITALS — BP 108/71 | HR 85 | Temp 97.5°F | Resp 16

## 2013-01-24 DIAGNOSIS — J310 Chronic rhinitis: Secondary | ICD-10-CM

## 2013-01-24 DIAGNOSIS — J209 Acute bronchitis, unspecified: Secondary | ICD-10-CM | POA: Insufficient documentation

## 2013-01-24 DIAGNOSIS — S51809A Unspecified open wound of unspecified forearm, initial encounter: Secondary | ICD-10-CM

## 2013-01-24 DIAGNOSIS — S51811A Laceration without foreign body of right forearm, initial encounter: Secondary | ICD-10-CM

## 2013-01-24 MED ORDER — IPRATROPIUM BROMIDE 0.03 % NA SOLN: NASAL | Status: DC

## 2013-01-24 NOTE — Progress Notes (Signed)
OFFICE NOTE  01/24/2013  CC:  Chief Complaint  Patient presents with  . Cough    x about a week  . chest congestion  . Dry Eye    another eye drop he can try??     HPI: [name] who is here with his daughter for cough. No new nasal sx's (has gustatory rhinitis). Cough onset about 1 wk ago, sounds like it is in his chest per staff at [facility].  No SOB or wheezing.  No ST.  No fever. Cognitive/mental status is at his baseline.   Pertinent PMH:  Past Medical History  Diagnosis Date  . History of bladder cancer   . Dementia     With hallucinations/paranoia.  July 2013 CT brain: atrophy + small vessel ischemic demyelination  . Osteoarthritis of both knees   . Urinary incontinence   . History of non anemic vitamin B12 deficiency 08/2011 (level was 171)    Vit B12 level normal (466) 11/2011 at previous PCP.  Marland Kitchen History of hypothyroidism     currently off meds  . History of hypertension     Normotensive off meds as of 11/2011  . Chronic renal insufficiency, stage III (moderate)     Borderline stage II/III (Est CrCl about 60).  . H/O phimosis 2012/2013    Urology recommended circumcision.  Cardiology cleared him for surgery 08/09/2012.   Past surgical, social, and family history reviewed and no changes noted since last office visit.  MEDS:  Outpatient Prescriptions Prior to Visit  Medication Sig Dispense Refill  . acetaminophen (TYLENOL) 500 MG tablet Take 1,000 mg by mouth 3 (three) times daily.      Marland Kitchen donepezil (ARICEPT) 5 MG tablet Take 1 tablet (5 mg total) by mouth at bedtime.  90 tablet  1  . Elastic Bandages & Supports (ANTI-EMBOLISM STOCKINGS MEDIUM) MISC 1 Package by Does not apply route daily. Apply in the am and remove at 8pm      . folic acid (FOLVITE) 1 MG tablet Take 1 tablet (1 mg total) by mouth daily.  90 tablet  3  . lidocaine (LIDODERM) 5 % Place 1 patch onto the skin daily as needed (for pain.). Remove & Discard patch within 12 hours  or as directed by MD      . Polyethyl Glycol-Propyl Glycol (SYSTANE OP) Place 2 drops into both eyes 3 (three) times daily.      . vitamin B-12 (CYANOCOBALAMIN) 1000 MCG tablet Take 1 tablet (1,000 mcg total) by mouth daily.  90 tablet  3   No facility-administered medications prior to visit.    PE: Blood pressure 108/71, pulse 85, temperature 97.5 F (36.4 C), temperature source Temporal, resp. rate 16, SpO2 96.00%. Gen: frail, elderly white male sitting in wheelchair in NAD.  Oriented to person and place. ENT: Ears: EACs clear, normal epithelium.  TMs with good light reflex and landmarks bilaterally.  Eyes: no injection, icteris, swelling, or exudate.  EOMI, PERRLA. Nose: no drainage or turbinate edema/swelling.  No injection or focal lesion.  Mouth: lips without lesion/swelling.  Oral mucosa pink and moist.  Dentition intact and without obvious caries or gingival swelling.  Oropharynx without erythema, exudate, or swelling.  Neck - No masses or thyromegaly or limitation in range of motion CV: RRR, no m/r/g LUNGS: he has bibasilar insp crackles R>L which are a chronic exam finding for him (correlate with scarring on CXR).  No other areas of crackles, no wheezing, no  prolongation of expiratory phase.  Breathing is nonlabored. EXT: no clubbing or cyanosis.  2+ pitting edema on left, 1+ pitting edema on right Right arm: skin tear on prox forearm volar surface, with dark scab covering defect.  No tenderness, no erythema, no warmth, no drainage.  Dressing (2 band-aids) are dried/adherent to the wound).  IMPRESSION AND PLAN:  Acute bronchitis Symptoms mild, no sign of RAD. Viral etiology suspected. I recommended against any cough suppressants at this time so he can adequately clear secretions and plus I don't want to sedate him any. Signs/symptoms to call or return for were reviewed and pt expressed understanding.   Gustatory rhinitis Trial of atropine nasal spray prior to each meal.  Skin  tear of right forearm without complication No sign of infection. He needs to have dressing changed daily. We attempted to get current band-aid dressing off today by soaking the area and they did loosen a bit but would not come off--we did not want to tear the skin further or hurt patient. We wrote an order for his ALF to change dressing daily.   An After Visit Summary was printed and given to the patient.  FOLLOW UP: 1 wk

## 2013-01-24 NOTE — Telephone Encounter (Signed)
Saw pt in office today

## 2013-01-25 ENCOUNTER — Emergency Department (HOSPITAL_BASED_OUTPATIENT_CLINIC_OR_DEPARTMENT_OTHER): Payer: Medicare Other

## 2013-01-25 ENCOUNTER — Inpatient Hospital Stay (HOSPITAL_BASED_OUTPATIENT_CLINIC_OR_DEPARTMENT_OTHER)
Admission: EM | Admit: 2013-01-25 | Discharge: 2013-01-29 | DRG: 690 | Disposition: A | Discharge status: Skilled Nursing Facility | Payer: Medicare Other | Attending: Family Medicine | Admitting: Family Medicine

## 2013-01-25 ENCOUNTER — Encounter (HOSPITAL_BASED_OUTPATIENT_CLINIC_OR_DEPARTMENT_OTHER): Payer: Self-pay | Admitting: *Deleted

## 2013-01-25 DIAGNOSIS — M545 Low back pain: Secondary | ICD-10-CM

## 2013-01-25 DIAGNOSIS — N390 Urinary tract infection, site not specified: Principal | ICD-10-CM | POA: Diagnosis present

## 2013-01-25 DIAGNOSIS — F0390 Unspecified dementia without behavioral disturbance: Secondary | ICD-10-CM

## 2013-01-25 DIAGNOSIS — N3941 Urge incontinence: Secondary | ICD-10-CM

## 2013-01-25 DIAGNOSIS — S51811A Laceration without foreign body of right forearm, initial encounter: Secondary | ICD-10-CM

## 2013-01-25 DIAGNOSIS — Z79899 Other long term (current) drug therapy: Secondary | ICD-10-CM

## 2013-01-25 DIAGNOSIS — I10 Essential (primary) hypertension: Secondary | ICD-10-CM | POA: Diagnosis present

## 2013-01-25 DIAGNOSIS — M170 Bilateral primary osteoarthritis of knee: Secondary | ICD-10-CM

## 2013-01-25 DIAGNOSIS — I129 Hypertensive chronic kidney disease with stage 1 through stage 4 chronic kidney disease, or unspecified chronic kidney disease: Secondary | ICD-10-CM | POA: Diagnosis present

## 2013-01-25 DIAGNOSIS — J40 Bronchitis, not specified as acute or chronic: Secondary | ICD-10-CM | POA: Diagnosis present

## 2013-01-25 DIAGNOSIS — J209 Acute bronchitis, unspecified: Secondary | ICD-10-CM

## 2013-01-25 DIAGNOSIS — J310 Chronic rhinitis: Secondary | ICD-10-CM

## 2013-01-25 DIAGNOSIS — R509 Fever, unspecified: Secondary | ICD-10-CM | POA: Diagnosis present

## 2013-01-25 DIAGNOSIS — R030 Elevated blood-pressure reading, without diagnosis of hypertension: Secondary | ICD-10-CM

## 2013-01-25 DIAGNOSIS — N189 Chronic kidney disease, unspecified: Secondary | ICD-10-CM | POA: Diagnosis present

## 2013-01-25 DIAGNOSIS — IMO0002 Reserved for concepts with insufficient information to code with codable children: Secondary | ICD-10-CM

## 2013-01-25 DIAGNOSIS — F0391 Unspecified dementia with behavioral disturbance: Secondary | ICD-10-CM | POA: Diagnosis present

## 2013-01-25 DIAGNOSIS — N471 Phimosis: Secondary | ICD-10-CM

## 2013-01-25 DIAGNOSIS — N182 Chronic kidney disease, stage 2 (mild): Secondary | ICD-10-CM | POA: Diagnosis present

## 2013-01-25 DIAGNOSIS — E039 Hypothyroidism, unspecified: Secondary | ICD-10-CM | POA: Diagnosis present

## 2013-01-25 DIAGNOSIS — D649 Anemia, unspecified: Secondary | ICD-10-CM

## 2013-01-25 DIAGNOSIS — R9431 Abnormal electrocardiogram [ECG] [EKG]: Secondary | ICD-10-CM

## 2013-01-25 MED ORDER — SODIUM CHLORIDE 0.9 % IV BOLUS (SEPSIS)
500.0000 mL | Freq: Once | INTRAVENOUS | Status: AC
  Administered 2013-01-26: 500 mL via INTRAVENOUS

## 2013-01-25 NOTE — ED Notes (Signed)
Family reports pt is from [facility]  fever and cough x 1 week seen by PMD yesterday for same Dx bronchitis , no xray done

## 2013-01-25 NOTE — ED Notes (Signed)
Pt transported to Xray. 

## 2013-01-25 NOTE — ED Provider Notes (Addendum)
History    CSN: [id number] Arrival date & time 01/25/13  2149  First MD Initiated Contact with Patient 01/25/13 2314     Chief Complaint  Patient presents with  . Fever   (Consider location/radiation/quality/duration/timing/severity/associated sxs/prior Treatment) HPI Comments: Patient presents with one week history of chest congestion, cough.  Started with fever today.  Was seen by pcp yesterday and was told likely was bronchitis.  No meds given.  He is a resident of a nursing with a history of dementia and majority of history taken from daughter who is at bedside.  Patient is a 79 y.o. male presenting with fever. The history is provided by the patient.  Fever Temp source:  Oral Severity:  Moderate Onset quality:  Gradual Timing:  Constant Progression:  Worsening Chronicity:  New Relieved by:  Nothing Worsened by:  Nothing tried Ineffective treatments:  None tried Associated symptoms: congestion and cough   Associated symptoms: no dysuria    Past Medical History  Diagnosis Date  . History of bladder cancer   . Dementia     With hallucinations/paranoia.  July 2013 CT brain: atrophy + small vessel ischemic demyelination  . Osteoarthritis of both knees   . Urinary incontinence   . History of non anemic vitamin B12 deficiency 08/2011 (level was 171)    Vit B12 level normal (466) 11/2011 at previous PCP.  Marland Kitchen History of hypothyroidism     currently off meds  . History of hypertension     Normotensive off meds as of 11/2011  . Chronic renal insufficiency, stage III (moderate)     Borderline stage II/III (Est CrCl about 60).  . H/O phimosis 2012/2013    Urology recommended circumcision.  Cardiology cleared him for surgery 08/09/2012.   Past Surgical History  Procedure Laterality Date  . Hernia repair    . Shoulder surgery      Right  . Cholecystectomy  1980s   Family History  Problem Relation Age of Onset  . Congestive Heart Failure Mother    History  Substance Use  Topics  . Smoking status: Never Smoker   . Smokeless tobacco: Never Used  . Alcohol Use: No    Review of Systems  Constitutional: Positive for fever.  HENT: Positive for congestion.   Respiratory: Positive for cough.   Genitourinary: Negative for dysuria.  All other systems reviewed and are negative.    Allergies  Keflex  Home Medications   Current Outpatient Rx  Name  Route  Sig  Dispense  Refill  . acetaminophen (TYLENOL) 500 MG tablet   Oral   Take 1,000 mg by mouth 3 (three) times daily.         Marland Kitchen donepezil (ARICEPT) 5 MG tablet   Oral   Take 1 tablet (5 mg total) by mouth at bedtime.   90 tablet   1     Please dispense in blister packaging.   Clinical research associate Bandages & Supports (ANTI-EMBOLISM STOCKINGS MEDIUM) MISC   Does not apply   1 Package by Does not apply route daily. Apply in the am and remove at 8pm         . folic acid (FOLVITE) 1 MG tablet   Oral   Take 1 tablet (1 mg total) by mouth daily.   90 tablet   3     Please dispense in blister packaging.   Marland Kitchen ipratropium (ATROVENT) 0.03 % nasal spray      1 spray in each nostril prior to each  meal every day   30 mL   12   . lidocaine (LIDODERM) 5 %   Transdermal   Place 1 patch onto the skin daily as needed (for pain.). Remove & Discard patch within 12 hours or as directed by MD         . Polyethyl Glycol-Propyl Glycol (SYSTANE OP)   Both Eyes   Place 2 drops into both eyes 3 (three) times daily.         . vitamin B-12 (CYANOCOBALAMIN) 1000 MCG tablet   Oral   Take 1 tablet (1,000 mcg total) by mouth daily.   90 tablet   3     Please dispense in blister packaging.    BP 116/65  Pulse 94  Temp(Src) 100.5 F (38.1 C) (Rectal)  Resp 16  SpO2 94% Physical Exam  Nursing note and vitals reviewed. Constitutional: He is oriented to person, place, and time.  Patient is an elderly male in no distress.  He is resting comfortably.  He appears confused and somnolent, but will arouse and answer  questions.  HENT:  Head: Normocephalic and atraumatic.  MM's dry.  Neck: Normal range of motion. Neck supple.  Cardiovascular: Normal rate, regular rhythm and normal heart sounds.   No murmur heard. Pulmonary/Chest: Effort normal and breath sounds normal.  Abdominal: Soft. Bowel sounds are normal. He exhibits no distension. There is no tenderness.  Musculoskeletal: Normal range of motion. He exhibits no edema.  Lymphadenopathy:    He has no cervical adenopathy.  Neurological: He is alert and oriented to person, place, and time.  Skin: Skin is warm and dry.    ED Course  Procedures (including critical care time) Labs Reviewed - No data to display Dg Chest 2 View  01/25/2013   *RADIOLOGY REPORT*  Clinical Data: Fever, weakness.  CHEST - 2 VIEW  Comparison: 01/10/2013  Findings: Mild cardiomegaly.  Elevation of the right hemidiaphragm. Lungs are clear.  No effusions.  No acute bony abnormality.  IMPRESSION: Cardiomegaly.  No acute findings.   Original Report Authenticated By: Charlett Nose, M.D.   No diagnosis found.   Date: 01/26/2013  Rate: 81  Rhythm: normal sinus rhythm  QRS Axis: left  Intervals: normal  ST/T Wave abnormalities: nonspecific T wave changes  Conduction Disutrbances:none  Narrative Interpretation:   Old EKG Reviewed: unchanged    MDM  Patient with fever, elevated wbc, uti.  Concern is for developing urosepsis.  I have spoken with Dr. Toniann Fail at [facility] who is willing to accept in transfer.  Levaquin IV given.  Cultures of blood and urine pending.  Geoffery Lyons, MD 01/26/13 4401  Geoffery Lyons, MD 01/26/13 0120

## 2013-01-26 ENCOUNTER — Encounter (HOSPITAL_COMMUNITY): Payer: Self-pay | Admitting: Internal Medicine

## 2013-01-26 DIAGNOSIS — R509 Fever, unspecified: Secondary | ICD-10-CM | POA: Diagnosis present

## 2013-01-26 DIAGNOSIS — F0390 Unspecified dementia without behavioral disturbance: Secondary | ICD-10-CM

## 2013-01-26 DIAGNOSIS — N390 Urinary tract infection, site not specified: Secondary | ICD-10-CM

## 2013-01-26 LAB — URINE MICROSCOPIC-ADD ON

## 2013-01-26 LAB — CBC WITH DIFFERENTIAL/PLATELET
Eosinophils Absolute: 0 10*3/uL (ref 0.0–0.7)
Eosinophils Absolute: 0 10*3/uL (ref 0.0–0.7)
Eosinophils Relative: 0 % (ref 0–5)
Hemoglobin: 12.3 g/dL — ABNORMAL LOW (ref 13.0–17.0)
Hemoglobin: 13.6 g/dL (ref 13.0–17.0)
Lymphocytes Relative: 5 % — ABNORMAL LOW (ref 12–46)
Lymphocytes Relative: 5 % — ABNORMAL LOW (ref 12–46)
Lymphs Abs: 0.6 10*3/uL — ABNORMAL LOW (ref 0.7–4.0)
Lymphs Abs: 0.6 10*3/uL — ABNORMAL LOW (ref 0.7–4.0)
MCH: 34.3 pg — ABNORMAL HIGH (ref 26.0–34.0)
MCH: 35.6 pg — ABNORMAL HIGH (ref 26.0–34.0)
MCV: 100.6 fL — ABNORMAL HIGH (ref 78.0–100.0)
MCV: 100.8 fL — ABNORMAL HIGH (ref 78.0–100.0)
Monocytes Relative: 10 % (ref 3–12)
Monocytes Relative: 10 % (ref 3–12)
Neutrophils Relative %: 84 % — ABNORMAL HIGH (ref 43–77)
RBC: 3.59 MIL/uL — ABNORMAL LOW (ref 4.22–5.81)
RBC: 3.82 MIL/uL — ABNORMAL LOW (ref 4.22–5.81)
WBC: 12.1 10*3/uL — ABNORMAL HIGH (ref 4.0–10.5)

## 2013-01-26 LAB — COMPREHENSIVE METABOLIC PANEL
AST: 23 U/L (ref 0–37)
Albumin: 2.8 g/dL — ABNORMAL LOW (ref 3.5–5.2)
Alkaline Phosphatase: 69 U/L (ref 39–117)
BUN: 35 mg/dL — ABNORMAL HIGH (ref 6–23)
CO2: 23 mEq/L (ref 19–32)
Calcium: 9 mg/dL (ref 8.4–10.5)
Creatinine, Ser: 1.15 mg/dL (ref 0.50–1.35)
GFR calc Af Amer: 50 mL/min — ABNORMAL LOW (ref >90)
GFR calc non Af Amer: 43 mL/min — ABNORMAL LOW (ref >90)
Glucose, Bld: 118 mg/dL — ABNORMAL HIGH (ref 70–99)
Potassium: 4.2 mEq/L (ref 3.5–5.1)
Total Bilirubin: 0.6 mg/dL (ref 0.3–1.2)
Total Protein: 5.9 g/dL — ABNORMAL LOW (ref 6.0–8.3)
Total Protein: 6.6 g/dL (ref 6.0–8.3)

## 2013-01-26 LAB — URINALYSIS, ROUTINE W REFLEX MICROSCOPIC
Ketones, ur: NEGATIVE mg/dL
Nitrite: NEGATIVE
Protein, ur: 30 mg/dL — AB
Urobilinogen, UA: 0.2 mg/dL (ref 0.0–1.0)
pH: 5.5 (ref 5.0–8.0)

## 2013-01-26 LAB — MRSA PCR SCREENING: MRSA by PCR: NEGATIVE

## 2013-01-26 LAB — TROPONIN I: Troponin I: <0.3 ng/mL (ref <0.30)

## 2013-01-26 MED ORDER — VITAMIN B-12 1000 MCG PO TABS
1000.0000 ug | ORAL_TABLET | Freq: Every day | ORAL | Status: DC
  Administered 2013-01-26 – 2013-01-29 (×4): 1000 ug via ORAL
  Filled 2013-01-26 (×4): qty 1

## 2013-01-26 MED ORDER — ANTI-EMBOLISM STOCKINGS MEDIUM MISC: 1.0000 | Freq: Every day | Status: DC

## 2013-01-26 MED ORDER — SODIUM CHLORIDE 0.9 % IV SOLN
INTRAVENOUS | Status: DC
  Administered 2013-01-26: 05:00:00 via INTRAVENOUS

## 2013-01-26 MED ORDER — ACETAMINOPHEN 650 MG RE SUPP: 650.0000 mg | Freq: Four times a day (QID) | RECTAL | Status: DC | PRN

## 2013-01-26 MED ORDER — LEVOFLOXACIN IN D5W 500 MG/100ML IV SOLN
500.0000 mg | INTRAVENOUS | Status: DC
  Filled 2013-01-26 (×2): qty 100

## 2013-01-26 MED ORDER — SODIUM CHLORIDE 0.9 % IV SOLN
INTRAVENOUS | Status: AC
  Administered 2013-01-26: 1000 mL via INTRAVENOUS

## 2013-01-26 MED ORDER — FOLIC ACID 1 MG PO TABS
1.0000 mg | ORAL_TABLET | Freq: Every day | ORAL | Status: DC
  Administered 2013-01-26 – 2013-01-29 (×4): 1 mg via ORAL
  Filled 2013-01-26 (×4): qty 1

## 2013-01-26 MED ORDER — ACETAMINOPHEN 325 MG PO TABS
650.0000 mg | ORAL_TABLET | Freq: Four times a day (QID) | ORAL | Status: DC | PRN
  Administered 2013-01-26: 650 mg via ORAL
  Filled 2013-01-26: qty 2

## 2013-01-26 MED ORDER — HYDRALAZINE HCL 20 MG/ML IJ SOLN
10.0000 mg | INTRAMUSCULAR | Status: DC | PRN
  Administered 2013-01-26 – 2013-01-27 (×2): 10 mg via INTRAVENOUS
  Filled 2013-01-26 (×2): qty 1

## 2013-01-26 MED ORDER — DONEPEZIL HCL 5 MG PO TABS
5.0000 mg | ORAL_TABLET | Freq: Every day | ORAL | Status: DC
  Administered 2013-01-26 – 2013-01-28 (×3): 5 mg via ORAL
  Filled 2013-01-26 (×4): qty 1

## 2013-01-26 MED ORDER — IPRATROPIUM BROMIDE 0.03 % NA SOLN
1.0000 | Freq: Two times a day (BID) | NASAL | Status: DC
  Administered 2013-01-27 – 2013-01-29 (×5): 1 via NASAL
  Filled 2013-01-26: qty 30

## 2013-01-26 MED ORDER — ONDANSETRON HCL 4 MG PO TABS: 4.0000 mg | ORAL_TABLET | Freq: Four times a day (QID) | ORAL | Status: DC | PRN

## 2013-01-26 MED ORDER — HEPARIN SODIUM (PORCINE) 5000 UNIT/ML IJ SOLN
5000.0000 [IU] | Freq: Three times a day (TID) | INTRAMUSCULAR | Status: DC
  Administered 2013-01-26 – 2013-01-29 (×11): 5000 [IU] via SUBCUTANEOUS
  Filled 2013-01-26 (×13): qty 1

## 2013-01-26 MED ORDER — LEVOFLOXACIN IN D5W 750 MG/150ML IV SOLN
750.0000 mg | Freq: Once | INTRAVENOUS | Status: AC
  Administered 2013-01-26: 750 mg via INTRAVENOUS
  Filled 2013-01-26: qty 150

## 2013-01-26 MED ORDER — LIDOCAINE 5 % EX PTCH
1.0000 | MEDICATED_PATCH | Freq: Every day | CUTANEOUS | Status: DC | PRN
  Filled 2013-01-26: qty 1

## 2013-01-26 MED ORDER — ONDANSETRON HCL 4 MG/2ML IJ SOLN
4.0000 mg | Freq: Four times a day (QID) | INTRAMUSCULAR | Status: DC | PRN
  Filled 2013-01-26: qty 2

## 2013-01-26 NOTE — ED Notes (Signed)
Report given to Leighton Parody, RN [facility] ETA 15-20 minutes.

## 2013-01-26 NOTE — H&P (Signed)
[facility] History and Physical  Joshua Gardner ZOX:[id number] DOB: June 30, 1922 DOA: 01/25/2013  Referring physician: Patient was transferred from [facility]. PCP: Jeoffrey Massed, MD   Chief Complaint: Fever.  Most of the history was obtained from ER physician who had discussed with patient's daughter. Patient is demented and does not provide history.  HPI: Joshua Gardner is a 79 y.o. male history of dementia and chronic kidney disease was brought to the ER at the [facility] as patient was found to be febrile at the nursing home. A day earlier patient was found to have cough and congestion and patient's PCP felt that this may have bronchitis but no antibiotics were started. In the ER patient was found to be febrile and UA was checked features concerning for possible UTI. ER physician felt that patient's condition may further worsen and felt the need 5 day antibiotics and close monitoring. On exam patient is not in acute distress. He is oriented to his name and follows commands. Denies any chest pain abdominal pain.  Review of Systems: As presented in the history of presenting illness, rest negative.  Past Medical History  Diagnosis Date  . History of bladder cancer   . Dementia     With hallucinations/paranoia.  July 2013 CT brain: atrophy + small vessel ischemic demyelination  . Osteoarthritis of both knees   . Urinary incontinence   . History of non anemic vitamin B12 deficiency 08/2011 (level was 171)    Vit B12 level normal (466) 11/2011 at previous PCP.  Marland Kitchen History of hypothyroidism     currently off meds  . History of hypertension     Normotensive off meds as of 11/2011  . Chronic renal insufficiency, stage III (moderate)     Borderline stage II/III (Est CrCl about 60).  . H/O phimosis 2012/2013    Urology recommended circumcision.  Cardiology cleared him for surgery 08/09/2012.   Past Surgical History  Procedure Laterality Date  . Hernia repair     . Shoulder surgery      Right  . Cholecystectomy  1980s   Social History:  reports that he has never smoked. He has never used smokeless tobacco. He reports that he does not drink alcohol or use illicit drugs. Nursing home. where does patient live-- Can do ADLs. Can patient participate in ADLs?  Allergies  Allergen Reactions  . Keflex (Cephalexin)     Doesn't remember reaction    Family History  Problem Relation Age of Onset  . Congestive Heart Failure Mother       Prior to Admission medications   Medication Sig Start Date End Date Taking? Authorizing Provider  acetaminophen (TYLENOL) 500 MG tablet Take 1,000 mg by mouth 3 (three) times daily.   Yes Historical Provider, MD  donepezil (ARICEPT) 5 MG tablet Take 1 tablet (5 mg total) by mouth at bedtime. 07/27/12   Jeoffrey Massed, MD  Elastic Bandages & Supports (ANTI-EMBOLISM STOCKINGS MEDIUM) MISC 1 Package by Does not apply route daily. Apply in the am and remove at 8pm    Historical Provider, MD  folic acid (FOLVITE) 1 MG tablet Take 1 tablet (1 mg total) by mouth daily. 07/27/12   Jeoffrey Massed, MD  ipratropium (ATROVENT) 0.03 % nasal spray 1 spray in each nostril prior to each meal every day 01/24/13   Jeoffrey Massed, MD  lidocaine (LIDODERM) 5 % Place 1 patch onto the skin daily as needed (for pain.). Remove & Discard patch  within 12 hours or as directed by MD    Historical Provider, MD  Polyethyl Glycol-Propyl Glycol (SYSTANE OP) Place 2 drops into both eyes 3 (three) times daily.    Historical Provider, MD  vitamin B-12 (CYANOCOBALAMIN) 1000 MCG tablet Take 1 tablet (1,000 mcg total) by mouth daily. 07/27/12   Jeoffrey Massed, MD   Physical Exam: Filed Vitals:   01/25/13 2155 01/25/13 2250 01/26/13 0107 01/26/13 0328  BP: 116/65  143/68 175/75  Pulse: 94  79 86  Temp: 97.3 F (36.3 C) 100.5 F (38.1 C)  98 F (36.7 C)  TempSrc: Oral Rectal  Oral  Resp: 16  22 22   Weight:    54.8 kg (120 lb 13 oz)  SpO2: 94%  97% 96%      General:  Well-developed and moderately nourished.  Eyes: Anicteric mild congestion around the medial part of the right eye.  ENT: No discharge from ears eyes nose mouth.  Neck: No mass felt.  Cardiovascular: S1-S2 heard.  Respiratory: No rhonchi or crepitations.  Abdomen: Soft nontender bowel sounds present.  Skin: No rash.  Musculoskeletal: No edema.  Psychiatric: Patient appears calm and quiet.  Neurologic: Patient is oriented to his name. Follows commands. Moves all extremities.  Labs on Admission:  Basic Metabolic Panel:  Recent Labs Lab 01/25/13 2347  NA 142  K 4.2  CL 107  CO2 23  GLUCOSE 118*  BUN 35*  CREATININE 1.40*  CALCIUM 10.0   Liver Function Tests:  Recent Labs Lab 01/25/13 2347  AST 28  ALT 16  ALKPHOS 69  BILITOT 0.6  PROT 6.6  ALBUMIN 3.3*   No results found for this basename: LIPASE, AMYLASE,  in the last 168 hours No results found for this basename: AMMONIA,  in the last 168 hours CBC:  Recent Labs Lab 01/25/13 2347  WBC 11.8*  NEUTROABS 9.9*  HGB 13.6  HCT 38.5*  MCV 100.8*  PLT 132*   Cardiac Enzymes:  Recent Labs Lab 01/25/13 2347  TROPONINI <0.30    BNP (last 3 results) No results found for this basename: PROBNP,  in the last 8760 hours CBG: No results found for this basename: GLUCAP,  in the last 168 hours  Radiological Exams on Admission: Dg Chest 2 View  01/25/2013   *RADIOLOGY REPORT*  Clinical Data: Fever, weakness.  CHEST - 2 VIEW  Comparison: 01/10/2013  Findings: Mild cardiomegaly.  Elevation of the right hemidiaphragm. Lungs are clear.  No effusions.  No acute bony abnormality.  IMPRESSION: Cardiomegaly.  No acute findings.   Original Report Authenticated By: Charlett Nose, M.D.     Assessment/Plan Principal Problem:   Fever Active Problems:   Dementia   Elevated blood pressure reading without diagnosis of hypertension   CKD (chronic kidney disease), stage II   UTI (urinary tract  infection)   1. Fever most likely from UTI and also possible bronchitis - continue with Levaquin per pharmacy and check urine cultures. 2. Elevated blood pressure - patient has been placed on when necessary IV hydralazine for systolic blood pressure more than 160. 3. Chronic kidney disease - creatinine appears to be at baseline. Closely follow metabolic panel intake output. 4. Dementia - no acute issues.    Code Status: Full code.  Family Communication: None.  Disposition Plan: Admit to inpatient.    Danney Bungert N. [facility] Pager [phone].  If 7PM-7AM, please contact night-coverage [email or web address] Password [email or web address] 01/26/2013, 5:00 AM

## 2013-01-26 NOTE — ED Notes (Signed)
MD at bedside. 

## 2013-01-26 NOTE — ED Notes (Signed)
Called [facility] for Hospitalist @ [employer]

## 2013-01-26 NOTE — Progress Notes (Signed)
ANTIBIOTIC CONSULT NOTE - INITIAL  Pharmacy Consult for levofloxacin Indication: COPD/UTI  Allergies  Allergen Reactions  . Keflex (Cephalexin)     Doesn't remember reaction    Patient Measurements: Height: 4' 11.06" (150 cm) Weight: 120 lb 13 oz (54.8 kg) IBW/kg (Calculated) : 47.83 Adjusted Body Weight:   Vital Signs: Temp: 98 F (36.7 C) (07/11 0328) Temp src: Oral (07/11 0328) BP: 175/75 mmHg (07/11 0328) Pulse Rate: 86 (07/11 0328) Intake/Output from previous day: 07/10 0701 - 07/11 0700 In: 1250 [I.V.:1250] Out: 60 [Urine:60] Intake/Output from this shift: Total I/O In: 1250 [I.V.:1250] Out: 60 [Urine:60]  Labs:  Recent Labs  01/25/13 2347  WBC 11.8*  HGB 13.6  PLT 132*  CREATININE 1.40*   Estimated Creatinine Clearance: 24.7 ml/min (by C-G formula based on Cr of 1.4). No results found for this basename: VANCOTROUGH, Leodis Binet, VANCORANDOM, GENTTROUGH, GENTPEAK, GENTRANDOM, TOBRATROUGH, TOBRAPEAK, TOBRARND, AMIKACINPEAK, AMIKACINTROU, AMIKACIN,  in the last 72 hours   Microbiology: Recent Results (from the past 720 hour(s))  MRSA PCR SCREENING     Status: None   Collection Time    01/26/13  4:16 AM      Result Value Range Status   MRSA by PCR NEGATIVE  NEGATIVE Final   Comment:            The GeneXpert MRSA Assay (FDA     approved for NASAL specimens     only), is one component of a     comprehensive MRSA colonization     surveillance program. It is not     intended to diagnose MRSA     infection nor to guide or     monitor treatment for     MRSA infections.    Medical History: Past Medical History  Diagnosis Date  . History of bladder cancer   . Dementia     With hallucinations/paranoia.  July 2013 CT brain: atrophy + small vessel ischemic demyelination  . Osteoarthritis of both knees   . Urinary incontinence   . History of non anemic vitamin B12 deficiency 08/2011 (level was 171)    Vit B12 level normal (466) 11/2011 at previous PCP.  Marland Kitchen  History of hypothyroidism     currently off meds  . History of hypertension     Normotensive off meds as of 11/2011  . Chronic renal insufficiency, stage III (moderate)     Borderline stage II/III (Est CrCl about 60).  . H/O phimosis 2012/2013    Urology recommended circumcision.  Cardiology cleared him for surgery 08/09/2012.    Medications:  Anti-infectives   Start     Dose/Rate Route Frequency Ordered Stop   01/27/13 2200  levofloxacin (LEVAQUIN) IVPB 500 mg     500 mg 100 mL/hr over 60 Minutes Intravenous Every 48 hours 01/26/13 0527     01/26/13 0115  levofloxacin (LEVAQUIN) IVPB 750 mg     750 mg 100 mL/hr over 90 Minutes Intravenous  Once 01/26/13 0114 01/26/13 0255     Assessment: Patient with COPD/UTI.  First dose of antibiotics already given in ED.  Patient with poor renal function  Goal of Therapy:    Levofloxacin dosed based on patient weight and renal function     Plan: Levofloxacin 500mg  iv q48hr Follow up culture results  Aleene Davidson Crowford 01/26/2013,5:33 AM

## 2013-01-26 NOTE — ED Notes (Signed)
Report given to [facility], RN [facility] Room 1505-01.

## 2013-01-26 NOTE — Progress Notes (Signed)
Patient seen and evaluated earlier today by my associate. Please refer to H and P for details regarding assessment and plan.  Will reassess next am.  Penny Pia

## 2013-01-27 DIAGNOSIS — N182 Chronic kidney disease, stage 2 (mild): Secondary | ICD-10-CM

## 2013-01-27 DIAGNOSIS — R9431 Abnormal electrocardiogram [ECG] [EKG]: Secondary | ICD-10-CM

## 2013-01-27 LAB — URINE CULTURE

## 2013-01-27 NOTE — Progress Notes (Signed)
Joshua Gardner:[id number] DOB: 24-Feb-1922 DOA: 01/25/2013 PCP: Jeoffrey Massed, MD  Assessment/Plan: 1) Fever most likely from UTI and also possible bronchitis - Agree with levaquin at this juncture - Urine culture shows no growth, urinalysis however shows large leukocytes and many bacteria - chest xray no acute findings reported.  2) Elevated blood pressure - patient has been placed on when necessary IV hydralazine for systolic blood pressure more than 160.   3) Chronic kidney disease  - creatinine appears to be at baseline.  - improved with IVF's  4) Dementia  - stable continue home regimen  Code Status: full Family Communication: Discussed with daughter Disposition Plan: Pending continued improvement in condition   Consultants:  none  Procedures:  None  Antibiotics:  Levaquin  HPI/Subjective: No new complaints. No acute issues reported overnight.  Objective: Filed Vitals:   01/26/13 1459 01/26/13 2200 01/27/13 0548 01/27/13 1536  BP: 156/81 146/85 172/84 179/80  Pulse: 94 87 74 68  Temp: 98.8 F (37.1 C) 98.8 F (37.1 C) 97.7 F (36.5 C) 99.1 F (37.3 C)  TempSrc: Oral Oral Axillary Oral  Resp: 18 18 16 20   Height:      Weight:   69.673 kg (153 lb 9.6 oz)   SpO2: 95% 95% 97% 95%    Intake/Output Summary (Last 24 hours) at 01/27/13 1619 Last data filed at 01/27/13 1519  Gross per 24 hour  Intake    600 ml  Output    600 ml  Net      0 ml   Filed Weights   01/26/13 0328 01/27/13 0548  Weight: 54.8 kg (120 lb 13 oz) 69.673 kg (153 lb 9.6 oz)    Exam:   General:  Pt in NAD, ALert and awake  Cardiovascular: RRR, no MRG  Respiratory: No wheezes, no increased work of breathing  Abdomen: soft, NT, ND  Musculoskeletal: no cyanosis or clubbing   Data Reviewed: Basic Metabolic Panel:  Recent Labs Lab 01/25/13 2347 01/26/13 0540  NA 142 139  K 4.2 3.9  CL 107 108  CO2 23 22  GLUCOSE 118* 83  BUN  35* 31*  CREATININE 1.40* 1.15  CALCIUM 10.0 9.0   Liver Function Tests:  Recent Labs Lab 01/25/13 2347 01/26/13 0540  AST 28 23  ALT 16 13  ALKPHOS 69 60  BILITOT 0.6 0.5  PROT 6.6 5.9*  ALBUMIN 3.3* 2.8*   No results found for this basename: LIPASE, AMYLASE,  in the last 168 hours No results found for this basename: AMMONIA,  in the last 168 hours CBC:  Recent Labs Lab 01/25/13 2347 01/26/13 0540  WBC 11.8* 12.1*  NEUTROABS 9.9* 10.3*  HGB 13.6 12.3*  HCT 38.5* 36.1*  MCV 100.8* 100.6*  PLT 132* 134*   Cardiac Enzymes:  Recent Labs Lab 01/25/13 2347  TROPONINI <0.30   BNP (last 3 results) No results found for this basename: PROBNP,  in the last 8760 hours CBG: No results found for this basename: GLUCAP,  in the last 168 hours  Recent Results (from the past 240 hour(s))  CULTURE, BLOOD (ROUTINE X 2)     Status: None   Collection Time    01/25/13 11:47 PM      Result Value Range Status   Specimen Description BLOOD LEFT FOREARM   Final   Special Requests     Final   Value: BOTTLES DRAWN AEROBIC AND ANAEROBIC 5cc AER 5cc ANA   Culture  Setup Time 01/26/2013 05:01   Final   Culture     Final   Value:        BLOOD CULTURE RECEIVED NO GROWTH TO DATE CULTURE WILL BE HELD FOR 5 DAYS BEFORE ISSUING A FINAL NEGATIVE REPORT   Report Status PENDING   Incomplete  URINE CULTURE     Status: None   Collection Time    01/26/13 12:02 AM      Result Value Range Status   Specimen Description URINE, CATHETERIZED   Final   Special Requests Normal   Final   Culture  Setup Time 01/26/2013 03:02   Final   Colony Count NO GROWTH   Final   Culture NO GROWTH   Final   Report Status 01/27/2013 FINAL   Final  CULTURE, BLOOD (ROUTINE X 2)     Status: None   Collection Time    01/26/13 12:50 AM      Result Value Range Status   Specimen Description BLOOD RIGHT FOREARM   Final   Special Requests     Final   Value: BOTTLES DRAWN AEROBIC AND ANAEROBIC 5cc AER 5cC ANA   Culture   Setup Time 01/26/2013 05:01   Final   Culture     Final   Value:        BLOOD CULTURE RECEIVED NO GROWTH TO DATE CULTURE WILL BE HELD FOR 5 DAYS BEFORE ISSUING A FINAL NEGATIVE REPORT   Report Status PENDING   Incomplete  MRSA PCR SCREENING     Status: None   Collection Time    01/26/13  4:16 AM      Result Value Range Status   MRSA by PCR NEGATIVE  NEGATIVE Final   Comment:            The GeneXpert MRSA Assay (FDA     approved for NASAL specimens     only), is one component of a     comprehensive MRSA colonization     surveillance program. It is not     intended to diagnose MRSA     infection nor to guide or     monitor treatment for     MRSA infections.     Studies: Dg Chest 2 View  01/25/2013   *RADIOLOGY REPORT*  Clinical Data: Fever, weakness.  CHEST - 2 VIEW  Comparison: 01/10/2013  Findings: Mild cardiomegaly.  Elevation of the right hemidiaphragm. Lungs are clear.  No effusions.  No acute bony abnormality.  IMPRESSION: Cardiomegaly.  No acute findings.   Original Report Authenticated By: Charlett Nose, M.D.    Scheduled Meds: . donepezil  5 mg Oral QHS  . folic acid  1 mg Oral Daily  . heparin  5,000 Units Subcutaneous Q8H  . ipratropium  1 spray Each Nare BID  . levofloxacin (LEVAQUIN) IV  500 mg Intravenous Q48H  . vitamin B-12  1,000 mcg Oral Daily   Continuous Infusions:   Principal Problem:   Fever Active Problems:   Dementia   Elevated blood pressure reading without diagnosis of hypertension   CKD (chronic kidney disease), stage II   UTI (urinary tract infection)    Time spent: > 35 minutes    Penny Pia  [facility] Pager [phone]. If 7PM-7AM, please contact night-coverage at [email or web address], password [facility] 01/27/2013, 4:19 PM  LOS: 2 days

## 2013-01-27 NOTE — Progress Notes (Signed)
Clinical Social Work Department BRIEF PSYCHOSOCIAL ASSESSMENT 01/27/2013  Patient:  Joshua Gardner, Joshua Gardner     Account Number:  [id number]     Admit date:  01/25/2013  Clinical Social Worker:  Doroteo Glassman  Date/Time:  01/27/2013 12:33 PM  Referred by:  Physician  Date Referred:  01/27/2013 Referred for  ALF Placement   Other Referral:   Interview type:  Other - See comment Other interview type:   Pt's daughter at bedside    PSYCHOSOCIAL DATA Living Status:  FACILITY Admitted from facility:  [facility] Level of care:  Assisted Living Primary support name:  Claudell Kyle Primary support relationship to patient:  CHILD, ADULT Degree of support available:   strong    CURRENT CONCERNS Current Concerns  Post-Acute Placement   Other Concerns:    SOCIAL WORK ASSESSMENT / PLAN Met with Pt, daughter and son-in-law to discuss d/c plans.    Pt is from [facility] and will return there upon d/c, per daughter.    CSW thanked Pt and family for their time.    Confirmed that Pt can return to [facility] upon d/c.   Assessment/plan status:   Other assessment/ plan:   Information/referral to community resources:   n/a    PATIENT'S/FAMILY'S RESPONSE TO PLAN OF CARE: Pt's daughter is pleased with [facility] and is happy with Pt returning there.   Providence Crosby, LCSWA Clinical Social Work [phone]

## 2013-01-28 LAB — CBC
HCT: 37.5 % — ABNORMAL LOW (ref 39.0–52.0)
MCH: 33.8 pg (ref 26.0–34.0)
MCV: 98.9 fL (ref 78.0–100.0)
Platelets: 151 10*3/uL (ref 150–400)
RBC: 3.79 MIL/uL — ABNORMAL LOW (ref 4.22–5.81)
RDW: 12.9 % (ref 11.5–15.5)

## 2013-01-28 MED ORDER — VITAMINS A & D EX OINT
TOPICAL_OINTMENT | CUTANEOUS | Status: AC
  Administered 2013-01-28: 06:00:00
  Filled 2013-01-28: qty 5

## 2013-01-28 MED ORDER — LEVOFLOXACIN 500 MG PO TABS: 500.0000 mg | ORAL_TABLET | Freq: Every day | ORAL | Status: AC

## 2013-01-28 NOTE — Discharge Summary (Signed)
Physician Discharge Summary  Joshua Gardner:[id number] DOB: [birth year] DOA: 01/25/2013  PCP: Jeoffrey Massed, MD  Admit date: 01/25/2013 Discharge date: 01/28/2013  Time spent: > 35 minutes  Recommendations for Outpatient Follow-up:  1. Please be sure to follow up with your primary care provider at the SNF 2. Patient will require physical therapy at SNF upon discharge  Discharge Diagnoses:  Principal Problem:   Fever Active Problems:   Dementia   Elevated blood pressure reading without diagnosis of hypertension   CKD (chronic kidney disease), stage II   UTI (urinary tract infection)   Discharge Condition: stable  Diet recommendation: Low sodium heart healthy  Filed Weights   01/26/13 0328 01/27/13 0548 01/28/13 0523  Weight: 54.8 kg (120 lb 13 oz) 69.673 kg (153 lb 9.6 oz) 64.5 kg (142 lb 3.2 oz)    History of present illness:  From original HPI: Joshua Gardner is a 79 y.o. male history of dementia and chronic kidney disease was brought [facility] the [facility] as patient was found to be febrile at the nursing home. A day earlier patient was found to have cough and congestion and patient's PCP felt that this may have bronchitis but no antibiotics were started. In the ER patient was found to be febrile and UA was checked features concerning for possible UTI. ER physician felt that patient's condition may further worsen and felt the need 5 day antibiotics and close monitoring. On exam patient is not in acute distress. He is oriented to his name and follows commands. Denies any chest pain abdominal pain.  Hospital Course:  1) Fever most likely from UTI and also possible bronchitis  - Will continue levaquin on discharge - Urine culture shows no growth, urinalysis however shows large leukocytes and many bacteria  - chest xray no acute findings reported.   2) Elevated blood pressure  - last value 135/67 and well controlled. - would continue low sodium diet.  3)  Chronic kidney disease  - creatinine appears to be at baseline.  - improved with IVF's   4) Dementia  - stable continue home regimen    Procedures:  None  Consultations:  None  Discharge Exam: Filed Vitals:   01/27/13 1536 01/27/13 1730 01/27/13 1900 01/28/13 0523  BP: 179/80 143/72 147/81 135/67  Pulse: 68  92 78  Temp: 99.1 F (37.3 C)  97.9 F (36.6 C) 98.4 F (36.9 C)  TempSrc: Oral  Oral Oral  Resp: 20  22 22   Height:      Weight:    64.5 kg (142 lb 3.2 oz)  SpO2: 95%  94% 94%    General: Pt in NAD, Alert and Awake Cardiovascular: RRR, no MRG Respiratory: CTA BL, no wheezes  Discharge Instructions  Discharge Orders   Future Appointments Provider Department Dept Phone   01/31/2013 3:30 PM Jeoffrey Massed, MD [facility] [place] [phone]   03/01/2013 3:15 PM Jeoffrey Massed, MD [facility] AT [place] [phone]   Future Orders Complete By Expires     Call MD for:  redness, tenderness, or signs of infection (pain, swelling, redness, odor or green/yellow discharge around incision site)  As directed     Call MD for:  temperature >100.4  As directed     Diet - low sodium heart healthy  As directed     Increase activity slowly  As directed         Medication List  acetaminophen 500 MG tablet  Commonly known as:  TYLENOL  Take 1,000 mg by mouth 3 (three) times daily.     Anti-Embolism Stockings Medium Misc  1 Package by Does not apply route daily. Apply in the am and remove at 8pm     donepezil 5 MG tablet  Commonly known as:  ARICEPT  Take 1 tablet (5 mg total) by mouth at bedtime.     folic acid 1 MG tablet  Commonly known as:  FOLVITE  Take 1 tablet (1 mg total) by mouth daily.     ipratropium 0.03 % nasal spray  Commonly known as:  ATROVENT  1 spray in each nostril prior to each meal every day     levofloxacin 500 MG tablet  Commonly known as:  LEVAQUIN  Take 1 tablet (500 mg total) by mouth daily.      lidocaine 5 %  Commonly known as:  LIDODERM  Place 1 patch onto the skin daily as needed (for pain.). Remove & Discard patch within 12 hours or as directed by MD     SYSTANE OP  Place 2 drops into both eyes 3 (three) times daily.     vitamin B-12 1000 MCG tablet  Commonly known as:  CYANOCOBALAMIN  Take 1 tablet (1,000 mcg total) by mouth daily.       Allergies  Allergen Reactions  . Keflex (Cephalexin)     Doesn't remember reaction      The results of significant diagnostics from this hospitalization (including imaging, microbiology, ancillary and laboratory) are listed below for reference.    Significant Diagnostic Studies: Dg Chest 2 View  01/25/2013   *RADIOLOGY REPORT*  Clinical Data: Fever, weakness.  CHEST - 2 VIEW  Comparison: 01/10/2013  Findings: Mild cardiomegaly.  Elevation of the right hemidiaphragm. Lungs are clear.  No effusions.  No acute bony abnormality.  IMPRESSION: Cardiomegaly.  No acute findings.   Original Report Authenticated By: Charlett Nose, M.D.   Dg Chest 2 View  01/10/2013   *RADIOLOGY REPORT*  Clinical Data: Altered mental status  CHEST - 2 VIEW  Comparison: Prior chest x-ray 07/05/2012  Findings: Stable appearance of the lungs with chronic low inspiratory volumes and small bilateral pleural effusions versus chronic pleural thickening.  Bibasilar atelectasis, pulmonary vascular congestion, prominence of the interstitial markings and cardiomegaly are all similar compared to prior.  Persistent atherosclerotic calcifications throughout the thoracic aorta. Surgical changes of prior right shoulder arthroplasty.  Advanced into the degenerative changes at the left glenohumeral joint are similar compared to prior.  IMPRESSION: No definite acute cardiopulmonary process.  Similar appearance of cardiomegaly, atherosclerosis and chronic lung disease compared to 07/05/2012.   Original Report Authenticated By: Malachy Moan, M.D.   Ct Head Wo Contrast  01/10/2013    *RADIOLOGY REPORT*  Clinical Data: Headaches with increased weakness.  CT HEAD WITHOUT CONTRAST  Technique:  Contiguous axial images were obtained from the base of the skull through the vertex without contrast.  Comparison: Head CT 11/11/2012 and 10/18/2012.  Findings: The patient's head is again tilted within the CT gantry. There is generalized atrophy.  No acute intracranial hemorrhage, mass lesion, brain edema or extra-axial fluid collection is demonstrated.  There is no evidence of cortical infarct.  Postsurgical changes are noted within the paranasal sinuses and nasal passages with diffuse nodular mucosal thickening.  No air- fluid levels are identified.  The calvarium is intact.  IMPRESSION:  1.  No acute intracranial findings. 2.  Stable chronic paranasal sinus  disease and postsurgical changes.   Original Report Authenticated By: Carey Bullocks, M.D.    Microbiology: Recent Results (from the past 240 hour(s))  CULTURE, BLOOD (ROUTINE X 2)     Status: None   Collection Time    01/25/13 11:47 PM      Result Value Range Status   Specimen Description BLOOD LEFT FOREARM   Final   Special Requests     Final   Value: BOTTLES DRAWN AEROBIC AND ANAEROBIC 5cc AER 5cc ANA   Culture  Setup Time 01/26/2013 05:01   Final   Culture     Final   Value:        BLOOD CULTURE RECEIVED NO GROWTH TO DATE CULTURE WILL BE HELD FOR 5 DAYS BEFORE ISSUING A FINAL NEGATIVE REPORT   Report Status PENDING   Incomplete  URINE CULTURE     Status: None   Collection Time    01/26/13 12:02 AM      Result Value Range Status   Specimen Description URINE, CATHETERIZED   Final   Special Requests Normal   Final   Culture  Setup Time 01/26/2013 03:02   Final   Colony Count NO GROWTH   Final   Culture NO GROWTH   Final   Report Status 01/27/2013 FINAL   Final  CULTURE, BLOOD (ROUTINE X 2)     Status: None   Collection Time    01/26/13 12:50 AM      Result Value Range Status   Specimen Description BLOOD RIGHT FOREARM    Final   Special Requests     Final   Value: BOTTLES DRAWN AEROBIC AND ANAEROBIC 5cc AER 5cC ANA   Culture  Setup Time 01/26/2013 05:01   Final   Culture     Final   Value:        BLOOD CULTURE RECEIVED NO GROWTH TO DATE CULTURE WILL BE HELD FOR 5 DAYS BEFORE ISSUING A FINAL NEGATIVE REPORT   Report Status PENDING   Incomplete  MRSA PCR SCREENING     Status: None   Collection Time    01/26/13  4:16 AM      Result Value Range Status   MRSA by PCR NEGATIVE  NEGATIVE Final   Comment:            The GeneXpert MRSA Assay (FDA     approved for NASAL specimens     only), is one component of a     comprehensive MRSA colonization     surveillance program. It is not     intended to diagnose MRSA     infection nor to guide or     monitor treatment for     MRSA infections.     Labs: Basic Metabolic Panel:  Recent Labs Lab 01/25/13 2347 01/26/13 0540  NA 142 139  K 4.2 3.9  CL 107 108  CO2 23 22  GLUCOSE 118* 83  BUN 35* 31*  CREATININE 1.40* 1.15  CALCIUM 10.0 9.0   Liver Function Tests:  Recent Labs Lab 01/25/13 2347 01/26/13 0540  AST 28 23  ALT 16 13  ALKPHOS 69 60  BILITOT 0.6 0.5  PROT 6.6 5.9*  ALBUMIN 3.3* 2.8*   No results found for this basename: LIPASE, AMYLASE,  in the last 168 hours No results found for this basename: AMMONIA,  in the last 168 hours CBC:  Recent Labs Lab 01/25/13 2347 01/26/13 0540 01/28/13 1122  WBC 11.8* 12.1* 7.0  NEUTROABS 9.9*  10.3*  --   HGB 13.6 12.3* 12.8*  HCT 38.5* 36.1* 37.5*  MCV 100.8* 100.6* 98.9  PLT 132* 134* 151   Cardiac Enzymes:  Recent Labs Lab 01/25/13 2347  TROPONINI <0.30   BNP: BNP (last 3 results) No results found for this basename: PROBNP,  in the last 8760 hours CBG: No results found for this basename: GLUCAP,  in the last 168 hours     Signed:  Penny Pia  [facility] 01/28/2013, 12:01 PM

## 2013-01-28 NOTE — Progress Notes (Addendum)
Per MD, Pt ready for d/c.  Notified facility.  Faxed D/c summary and FL2, as requested.  Spoke with Marchelle Folks at facility.  Per Marchelle Folks, facility needs to assess Pt before he can return.  Notified MD and RN.  RN to notify daughter.  Providence Crosby, LCSWA Clinical Social Work [phone]

## 2013-01-29 DIAGNOSIS — R030 Elevated blood-pressure reading, without diagnosis of hypertension: Secondary | ICD-10-CM

## 2013-01-29 NOTE — Evaluation (Signed)
Physical Therapy Evaluation Patient Details Name: Joshua Gardner MRN: [id number] DOB: Dec 02, 1921 Today's Date: 01/29/2013 Time: 1215-1230 PT Time Calculation (min): 15 min  PT Assessment / Plan / Recommendation History of Present Illness  79 y.o. male with h/o dementia admitted from ALF with fever, UTI.   Clinical Impression  *Max assist for supine to sit and to pivot to recliner, pt has heavy posterior lean in standing. Unclear what prior functional status was. **    PT Assessment  Patient needs continued PT services    Follow Up Recommendations  SNF;Supervision/Assistance - 24 hour    Does the patient have the potential to tolerate intense rehabilitation      Barriers to Discharge        Equipment Recommendations  None recommended by PT    Recommendations for Other Services     Frequency Min 3X/week    Precautions / Restrictions Precautions Precautions: Fall Restrictions Weight Bearing Restrictions: No   Pertinent Vitals/Pain *pt c/o R foot pain at rest, able to WB to pivot to chair RN notified, pt unable to provide details regarding pain**      Mobility  Bed Mobility Bed Mobility: Supine to Sit Supine to Sit: 2: Max assist Details for Bed Mobility Assistance: assist to advance BLEs and to elevate trunk Transfers Transfers: Sit to Stand;Stand to Sit;Stand Pivot Transfers Sit to Stand: From bed;1: +1 Total assist Stand to Sit: To chair/3-in-1;1: +1 Total assist Stand Pivot Transfers: 1: +1 Total assist Details for Transfer Assistance: pt 30%, pt had heavy posterior lean in standing so unable to ambulate Ambulation/Gait Ambulation/Gait Assistance: Not tested (comment)    Exercises     PT Diagnosis: Difficulty walking;Generalized weakness  PT Problem List: Decreased strength;Decreased activity tolerance;Decreased mobility PT Treatment Interventions: Functional mobility training;Therapeutic activities     PT Goals(Current goals can be found in the care  plan section) Acute Rehab PT Goals Patient Stated Goal: none stated PT Goal Formulation: Patient unable to participate in goal setting Time For Goal Achievement: 02/12/13 Potential to Achieve Goals: Fair  Visit Information  Last PT Received On: 01/29/13 Assistance Needed: +2 (+2 for ambulation) History of Present Illness: 79 y.o. male with h/o dementia admitted from ALF with fever, UTI.        Prior Functioning  Home Living Family/patient expects to be discharged to:: Assisted living Home Equipment: Wheelchair - manual Prior Function Comments: unable to determine exact PLOF with ADL. pt not able to answer all questions. Communication Communication: No difficulties    Cognition  Cognition Arousal/Alertness: Awake/alert Behavior During Therapy: WFL for tasks assessed/performed Overall Cognitive Status: No family/caregiver present to determine baseline cognitive functioning (h/o dementia per chart) Memory: Decreased short-term memory    Extremity/Trunk Assessment Upper Extremity Assessment Upper Extremity Assessment: Overall WFL for tasks assessed Lower Extremity Assessment Lower Extremity Assessment: Generalized weakness Cervical / Trunk Assessment Cervical / Trunk Assessment: Kyphotic   Balance Balance Balance Assessed: Yes Static Sitting Balance Static Sitting - Balance Support: Bilateral upper extremity supported;Feet supported Static Sitting - Level of Assistance: 5: Stand by assistance Static Sitting - Comment/# of Minutes: 2  End of Session PT - End of Session Equipment Utilized During Treatment: Gait belt Activity Tolerance: Patient limited by fatigue Patient left: in chair;with chair alarm set;with call bell/phone within reach;with nursing/sitter in room Nurse Communication: Mobility status  GP     Ralene Bathe Kistler 01/29/2013, 1:02 PM [phone]

## 2013-01-29 NOTE — Progress Notes (Signed)
Patient cleared for discharge. Spoke with candace at [facility]. Faxed needed information.  Margarete Horace C. Vicenta Olds MSW, LCSW [phone]

## 2013-01-29 NOTE — Progress Notes (Signed)
[facility] PROGRESS NOTE  Joshua Gardner ZOX:[id number] DOB: 15-Mar-1922 DOA: 01/25/2013 PCP: Jeoffrey Massed, MD  Assessment/Plan: 1) Fever most likely from UTI and also possible bronchitis - Continue levaquin at this point - Urine culture shows no growth, urinalysis however shows large leukocytes and many bacteria - chest xray no acute findings reported.  2) Elevated blood pressure - patient has been placed on when necessary IV hydralazine for systolic blood pressure more than 160.  - recommend low sodium diet. With last BP 121/76  3) Chronic kidney disease  - creatinine appears to be at baseline.  - improved with IVF's  4) Dementia  - stable continue home regimen  Code Status: full Family Communication: Discussed with daughter Disposition Plan: Awaiting discharge after patient evaluated by receiving facility.  Social worker aware and currently faxing out needed information. D/C summary complete as well as med rec.   Consultants:  none  Procedures:  None  Antibiotics:  Levaquin  HPI/Subjective:  No acute issues reported overnight.  Objective: Filed Vitals:   01/28/13 0523 01/28/13 1447 01/28/13 2240 01/29/13 0504  BP: 135/67 108/46 149/88 121/76  Pulse: 78 75 71 80  Temp: 98.4 F (36.9 C) 97.6 F (36.4 C) 98.3 F (36.8 C) 98.2 F (36.8 C)  TempSrc: Oral Oral Oral Oral  Resp: 22 20 20 20   Height:      Weight: 64.5 kg (142 lb 3.2 oz)     SpO2: 94% 97% 92% 94%    Intake/Output Summary (Last 24 hours) at 01/29/13 1108 Last data filed at 01/28/13 1856  Gross per 24 hour  Intake    120 ml  Output      0 ml  Net    120 ml   Filed Weights   01/26/13 0328 01/27/13 0548 01/28/13 0523  Weight: 54.8 kg (120 lb 13 oz) 69.673 kg (153 lb 9.6 oz) 64.5 kg (142 lb 3.2 oz)    Exam:   General:  Pt in NAD, ALert and awake  Cardiovascular: RRR, no MRG  Respiratory: No wheezes, no increased work of breathing  Abdomen: soft, NT, ND  Musculoskeletal:  no cyanosis or clubbing   Data Reviewed: Basic Metabolic Panel:  Recent Labs Lab 01/25/13 2347 01/26/13 0540  NA 142 139  K 4.2 3.9  CL 107 108  CO2 23 22  GLUCOSE 118* 83  BUN 35* 31*  CREATININE 1.40* 1.15  CALCIUM 10.0 9.0   Liver Function Tests:  Recent Labs Lab 01/25/13 2347 01/26/13 0540  AST 28 23  ALT 16 13  ALKPHOS 69 60  BILITOT 0.6 0.5  PROT 6.6 5.9*  ALBUMIN 3.3* 2.8*   No results found for this basename: LIPASE, AMYLASE,  in the last 168 hours No results found for this basename: AMMONIA,  in the last 168 hours CBC:  Recent Labs Lab 01/25/13 2347 01/26/13 0540 01/28/13 1122  WBC 11.8* 12.1* 7.0  NEUTROABS 9.9* 10.3*  --   HGB 13.6 12.3* 12.8*  HCT 38.5* 36.1* 37.5*  MCV 100.8* 100.6* 98.9  PLT 132* 134* 151   Cardiac Enzymes:  Recent Labs Lab 01/25/13 2347  TROPONINI <0.30   BNP (last 3 results) No results found for this basename: PROBNP,  in the last 8760 hours CBG: No results found for this basename: GLUCAP,  in the last 168 hours  Recent Results (from the past 240 hour(s))  CULTURE, BLOOD (ROUTINE X 2)     Status: None   Collection Time    01/25/13 11:47  PM      Result Value Range Status   Specimen Description BLOOD LEFT FOREARM   Final   Special Requests     Final   Value: BOTTLES DRAWN AEROBIC AND ANAEROBIC 5cc AER 5cc ANA   Culture  Setup Time 01/26/2013 05:01   Final   Culture     Final   Value:        BLOOD CULTURE RECEIVED NO GROWTH TO DATE CULTURE WILL BE HELD FOR 5 DAYS BEFORE ISSUING A FINAL NEGATIVE REPORT   Report Status PENDING   Incomplete  URINE CULTURE     Status: None   Collection Time    01/26/13 12:02 AM      Result Value Range Status   Specimen Description URINE, CATHETERIZED   Final   Special Requests Normal   Final   Culture  Setup Time 01/26/2013 03:02   Final   Colony Count NO GROWTH   Final   Culture NO GROWTH   Final   Report Status 01/27/2013 FINAL   Final  CULTURE, BLOOD (ROUTINE X 2)      Status: None   Collection Time    01/26/13 12:50 AM      Result Value Range Status   Specimen Description BLOOD RIGHT FOREARM   Final   Special Requests     Final   Value: BOTTLES DRAWN AEROBIC AND ANAEROBIC 5cc AER 5cC ANA   Culture  Setup Time 01/26/2013 05:01   Final   Culture     Final   Value:        BLOOD CULTURE RECEIVED NO GROWTH TO DATE CULTURE WILL BE HELD FOR 5 DAYS BEFORE ISSUING A FINAL NEGATIVE REPORT   Report Status PENDING   Incomplete  MRSA PCR SCREENING     Status: None   Collection Time    01/26/13  4:16 AM      Result Value Range Status   MRSA by PCR NEGATIVE  NEGATIVE Final   Comment:            The GeneXpert MRSA Assay (FDA     approved for NASAL specimens     only), is one component of a     comprehensive MRSA colonization     surveillance program. It is not     intended to diagnose MRSA     infection nor to guide or     monitor treatment for     MRSA infections.     Studies: No results found.  Scheduled Meds: . donepezil  5 mg Oral QHS  . folic acid  1 mg Oral Daily  . heparin  5,000 Units Subcutaneous Q8H  . ipratropium  1 spray Each Nare BID  . levofloxacin (LEVAQUIN) IV  500 mg Intravenous Q48H  . vitamin B-12  1,000 mcg Oral Daily   Continuous Infusions:   Principal Problem:   Fever Active Problems:   Dementia   Elevated blood pressure reading without diagnosis of hypertension   CKD (chronic kidney disease), stage II   UTI (urinary tract infection)    Time spent: > 35 minutes    Penny Pia  [facility] Pager [phone]. If 7PM-7AM, please contact night-coverage at [email or web address], password [facility] 01/29/2013, 11:08 AM  LOS: 4 days

## 2013-01-29 NOTE — Progress Notes (Addendum)
Patient cleared for discharge. CSW faxed physical therapy evaluation with [facility] and discussed with them that patient needs assistance with transfers. Patient was in a wheelchair prior admission. They state that patient is fine to return. Packet copied and placed in [place]. Spoke with patient's daughter. She will transport patient.  Deryn Massengale C. Zeth Buday MSW, LCSW [phone]

## 2013-01-31 ENCOUNTER — Ambulatory Visit: Payer: Medicare Other | Admitting: Family Medicine

## 2013-01-31 NOTE — Assessment & Plan Note (Signed)
No sign of infection. He needs to have dressing changed daily. We attempted to get current band-aid dressing off today by soaking the area and they did loosen a bit but would not come off--we did not want to tear the skin further or hurt patient. We wrote an order for his ALF to change dressing daily.

## 2013-01-31 NOTE — Assessment & Plan Note (Signed)
Trial of atropine nasal spray prior to each meal.

## 2013-01-31 NOTE — Assessment & Plan Note (Signed)
Symptoms mild, no sign of RAD. Viral etiology suspected. I recommended against any cough suppressants at this time so he can adequately clear secretions and plus I don't want to sedate him any. Signs/symptoms to call or return for were reviewed and pt expressed understanding.

## 2013-02-01 ENCOUNTER — Telehealth: Payer: Self-pay | Admitting: Family Medicine

## 2013-02-01 ENCOUNTER — Other Ambulatory Visit: Payer: Self-pay | Admitting: Family Medicine

## 2013-02-01 LAB — CULTURE, BLOOD (ROUTINE X 2): Culture: NO GROWTH

## 2013-02-01 MED ORDER — PREDNISONE 20 MG PO TABS: ORAL_TABLET | ORAL | Status: DC

## 2013-02-01 MED ORDER — LEVOFLOXACIN 500 MG PO TABS: 500.0000 mg | ORAL_TABLET | Freq: Every day | ORAL | Status: DC

## 2013-02-01 NOTE — Telephone Encounter (Signed)
(  I talked to daughter Dois Davenport on phone and she says pt is coughing more and more and is wanting to sleep all the time. Of note, he was d/c'd from hosp 4 d/a with instructions to take levaquin for 10d but no rx was given at the time of d/c, and our office did not get a call from his ALF until today. His urine clx from hospitalization showed NO GROWTH.   Based on his prolonged bronchitis illness/worsening, will give the levaquin 500 mg qd x 10d and prednisone 40mg  qd x 5d. Daughter is in agreement with this plan.)  Lisa--do we need to send an order to his assisted living facility in order for his daughter to give him prednisone and levaquin?

## 2013-02-01 NOTE — Telephone Encounter (Signed)
Spoke to [employer] at [facility] and she said they have the order for levaquin but no order for prednisone.  She said to fax it to [phone]

## 2013-02-01 NOTE — Telephone Encounter (Signed)
Order written

## 2013-02-01 NOTE — Telephone Encounter (Signed)
Called in levaquin to [facility] on [address] per [employer], okay per Dr Milinda Cave.

## 2013-02-28 ENCOUNTER — Telehealth: Payer: Self-pay | Admitting: Emergency Medicine

## 2013-02-28 NOTE — Telephone Encounter (Signed)
Please advise 

## 2013-02-28 NOTE — Telephone Encounter (Signed)
Patients daughter called and states [facility] needs an order to cut the patients meats up at meal time, he is having trouble eating and other residents are cutting the meats for him.

## 2013-03-01 ENCOUNTER — Encounter: Payer: Self-pay | Admitting: Family Medicine

## 2013-03-01 ENCOUNTER — Ambulatory Visit (INDEPENDENT_AMBULATORY_CARE_PROVIDER_SITE_OTHER): Payer: Medicare Other | Admitting: Family Medicine

## 2013-03-01 VITALS — BP 128/77 | HR 70 | Temp 98.2°F | Resp 16

## 2013-03-01 DIAGNOSIS — F0391 Unspecified dementia with behavioral disturbance: Secondary | ICD-10-CM

## 2013-03-01 DIAGNOSIS — J310 Chronic rhinitis: Secondary | ICD-10-CM

## 2013-03-01 DIAGNOSIS — M1710 Unilateral primary osteoarthritis, unspecified knee: Secondary | ICD-10-CM

## 2013-03-01 DIAGNOSIS — M170 Bilateral primary osteoarthritis of knee: Secondary | ICD-10-CM

## 2013-03-01 DIAGNOSIS — F0390 Unspecified dementia without behavioral disturbance: Secondary | ICD-10-CM

## 2013-03-01 DIAGNOSIS — R05 Cough: Secondary | ICD-10-CM

## 2013-03-01 MED ORDER — DONEPEZIL HCL 5 MG PO TABS: 5.0000 mg | ORAL_TABLET | Freq: Every day | ORAL | Status: DC

## 2013-03-01 NOTE — Telephone Encounter (Signed)
Rx written while pt here at o/v today.   Copy given to pt's daughter.

## 2013-03-01 NOTE — Progress Notes (Addendum)
OFFICE NOTE  03/01/2013  CC:  Chief Complaint  Patient presents with  . Dementia     HPI: [name] who is here for 3 mo f/u dementia. Stable.  Still requiring assistance with basic ADL's.  No signif behavior problems or periods of delirium lately. Slight cough restarted last few days, no SOB or fevers (710-7/13 he was hospitalized for fever and resp sx's.  Then on 7/17 he took a course of prednisone and levaquin for lingering resp sx's and this led to resolution of sx's completely). Order needed for his ALF to "kitchen please cut food into bite sized pieces" due to his trouble with large boluses of food in the past.  His rhinitis at mealtime has resolved with use of ipratropium nasal spray at meals.  Pertinent PMH:  Past Medical History  Diagnosis Date  . History of bladder cancer   . Dementia     With hallucinations/paranoia.  July 2013 CT brain: atrophy + small vessel ischemic demyelination  . Osteoarthritis of both knees   . Urinary incontinence   . History of non anemic vitamin B12 deficiency 08/2011 (level was 171)    Vit B12 level normal (466) 11/2011 at previous PCP.  Marland Kitchen History of hypothyroidism     currently off meds  . History of hypertension     Normotensive off meds as of 11/2011  . Chronic renal insufficiency, stage III (moderate)     Borderline stage II/III (Est CrCl about 60).  . H/O phimosis 2012/2013    Urology recommended circumcision.  Cardiology cleared him for surgery 08/09/2012.    MEDS:  Outpatient Prescriptions Prior to Visit  Medication Sig Dispense Refill  . acetaminophen (TYLENOL) 500 MG tablet Take 1,000 mg by mouth 3 (three) times daily.      Marland Kitchen donepezil (ARICEPT) 5 MG tablet Take 1 tablet (5 mg total) by mouth at bedtime.  90 tablet  1  . folic acid (FOLVITE) 1 MG tablet Take 1 tablet (1 mg total) by mouth daily.  90 tablet  3  . ipratropium (ATROVENT) 0.03 % nasal spray 1 spray in each nostril prior to each meal every  day  30 mL  12  . Polyethyl Glycol-Propyl Glycol (SYSTANE OP) Place 2 drops into both eyes 3 (three) times daily.      . vitamin B-12 (CYANOCOBALAMIN) 1000 MCG tablet Take 1 tablet (1,000 mcg total) by mouth daily.  90 tablet  3  . Elastic Bandages & Supports (ANTI-EMBOLISM STOCKINGS MEDIUM) MISC 1 Package by Does not apply route daily. Apply in the am and remove at 8pm      . levofloxacin (LEVAQUIN) 500 MG tablet Take 1 tablet (500 mg total) by mouth daily.  10 tablet  0  . lidocaine (LIDODERM) 5 % Place 1 patch onto the skin daily as needed (for pain.). Remove & Discard patch within 12 hours or as directed by MD      . predniSONE (DELTASONE) 20 MG tablet 2 tabs po qd x 5d  10 tablet  0   No facility-administered medications prior to visit.    PE: Blood pressure 128/77, pulse 70, temperature 98.2 F (36.8 C), temperature source Temporal, resp. rate 16, SpO2 97.00%. Gen: alert, oriented to person and place only.  Frail-appearing, sitting in WC.  In NAD. He would repeat himself some during our interaction. CV: RRR, no m/r/g.   LUNGS: CTA bilat, nonlabored resps, good aeration in all lung fields.   IMPRESSION AND  PLAN:  Dementia with behavioral disturbance Stable. Continue aricept 5mg  qd.  Gustatory rhinitis Much improved with use of ipratropium nasal spray.  Osteoarthritis of both knees Improved with recent steroid injections by ortho.   Cough: no sign of significant illness.  No new meds for this at this time.   FOLLOW UP: 44mo f/u dementia

## 2013-03-15 ENCOUNTER — Ambulatory Visit: Payer: Medicare Other | Admitting: Family Medicine

## 2013-03-26 ENCOUNTER — Telehealth: Payer: Self-pay | Admitting: Family Medicine

## 2013-03-26 NOTE — Telephone Encounter (Signed)
I looked through the media section of Joshua Gardner' chart and I can't find her prior FMLA forms, so I guess she has to bring by new ones.  -thx

## 2013-03-26 NOTE — Telephone Encounter (Signed)
Please advise 

## 2013-03-27 NOTE — Telephone Encounter (Signed)
Patient's daughter aware.  She will bring [place] new FMLA paperwork when she gets it from her employer.

## 2013-03-29 ENCOUNTER — Telehealth: Payer: Self-pay | Admitting: Family Medicine

## 2013-03-29 NOTE — Telephone Encounter (Signed)
Joshua Gardner called wanting to see if you could send patient's nursing home orders to do [facility] because she believes patient has UTI again.  Please advise.

## 2013-03-29 NOTE — Telephone Encounter (Signed)
done

## 2013-04-06 ENCOUNTER — Telehealth: Payer: Self-pay | Admitting: Family Medicine

## 2013-04-06 NOTE — Telephone Encounter (Signed)
OK to restart PT and OT.-thx

## 2013-04-06 NOTE — Telephone Encounter (Signed)
Joshua Gardner called today to see if you would give a verbal order to restart his PT and OT.  Please advise.

## 2013-04-09 DIAGNOSIS — R05 Cough: Secondary | ICD-10-CM | POA: Insufficient documentation

## 2013-04-09 NOTE — Telephone Encounter (Signed)
Verbal orders given to [facility] at [employer].

## 2013-04-13 DIAGNOSIS — N183 Chronic kidney disease, stage 3 (moderate): Secondary | ICD-10-CM

## 2013-04-13 DIAGNOSIS — M1710 Unilateral primary osteoarthritis, unspecified knee: Secondary | ICD-10-CM

## 2013-04-13 DIAGNOSIS — F0390 Unspecified dementia without behavioral disturbance: Secondary | ICD-10-CM

## 2013-04-13 DIAGNOSIS — I129 Hypertensive chronic kidney disease with stage 1 through stage 4 chronic kidney disease, or unspecified chronic kidney disease: Secondary | ICD-10-CM

## 2013-04-16 NOTE — Assessment & Plan Note (Signed)
Improved with recent steroid injections by ortho.

## 2013-04-16 NOTE — Addendum Note (Signed)
Addended by: Jeoffrey Massed on: 04/16/2013 09:12 AM   Modules accepted: Level of Service

## 2013-04-16 NOTE — Assessment & Plan Note (Signed)
Much improved with use of ipratropium nasal spray.

## 2013-04-16 NOTE — Assessment & Plan Note (Signed)
Stable. Continue aricept 5mg  qd.

## 2013-05-08 ENCOUNTER — Other Ambulatory Visit: Payer: Self-pay | Admitting: Family Medicine

## 2013-05-08 MED ORDER — MAGIC MOUTHWASH: ORAL | Status: DC

## 2013-05-19 DIAGNOSIS — M19032 Primary osteoarthritis, left wrist: Secondary | ICD-10-CM

## 2013-05-19 HISTORY — PX: OTHER SURGICAL HISTORY: SHX169

## 2013-05-19 HISTORY — DX: Primary osteoarthritis, left wrist: M19.032

## 2013-05-21 ENCOUNTER — Telehealth: Payer: Self-pay | Admitting: Family Medicine

## 2013-05-21 MED ORDER — DONEPEZIL HCL 5 MG PO TABS: 5.0000 mg | ORAL_TABLET | Freq: Every day | ORAL | Status: DC

## 2013-05-21 NOTE — Telephone Encounter (Signed)
Patients daughter is calling to get script for generic Aricept sent directly to the [place] (fax# [phone]), call daughter when completed

## 2013-05-21 NOTE — Telephone Encounter (Signed)
Aricept faxed to [facility] and patient's daughter aware.

## 2013-05-24 ENCOUNTER — Other Ambulatory Visit: Payer: Self-pay

## 2013-05-26 ENCOUNTER — Emergency Department (HOSPITAL_COMMUNITY): Payer: Medicare Other

## 2013-05-26 ENCOUNTER — Emergency Department (HOSPITAL_COMMUNITY)
Admission: EM | Admit: 2013-05-26 | Discharge: 2013-05-27 | Disposition: A | Discharge status: Home / Self Care | Payer: Medicare Other | Attending: Emergency Medicine | Admitting: Emergency Medicine

## 2013-05-26 ENCOUNTER — Encounter (HOSPITAL_COMMUNITY): Payer: Self-pay | Admitting: Emergency Medicine

## 2013-05-26 DIAGNOSIS — F0390 Unspecified dementia without behavioral disturbance: Secondary | ICD-10-CM | POA: Insufficient documentation

## 2013-05-26 DIAGNOSIS — S51009A Unspecified open wound of unspecified elbow, initial encounter: Secondary | ICD-10-CM | POA: Insufficient documentation

## 2013-05-26 DIAGNOSIS — S51012A Laceration without foreign body of left elbow, initial encounter: Secondary | ICD-10-CM

## 2013-05-26 DIAGNOSIS — Z23 Encounter for immunization: Secondary | ICD-10-CM | POA: Insufficient documentation

## 2013-05-26 DIAGNOSIS — Z8551 Personal history of malignant neoplasm of bladder: Secondary | ICD-10-CM | POA: Insufficient documentation

## 2013-05-26 DIAGNOSIS — Z79899 Other long term (current) drug therapy: Secondary | ICD-10-CM | POA: Insufficient documentation

## 2013-05-26 DIAGNOSIS — R296 Repeated falls: Secondary | ICD-10-CM | POA: Insufficient documentation

## 2013-05-26 DIAGNOSIS — Z862 Personal history of diseases of the blood and blood-forming organs and certain disorders involving the immune mechanism: Secondary | ICD-10-CM | POA: Insufficient documentation

## 2013-05-26 DIAGNOSIS — W19XXXA Unspecified fall, initial encounter: Secondary | ICD-10-CM

## 2013-05-26 DIAGNOSIS — IMO0002 Reserved for concepts with insufficient information to code with codable children: Secondary | ICD-10-CM | POA: Insufficient documentation

## 2013-05-26 DIAGNOSIS — I129 Hypertensive chronic kidney disease with stage 1 through stage 4 chronic kidney disease, or unspecified chronic kidney disease: Secondary | ICD-10-CM | POA: Insufficient documentation

## 2013-05-26 DIAGNOSIS — M1710 Unilateral primary osteoarthritis, unspecified knee: Secondary | ICD-10-CM | POA: Insufficient documentation

## 2013-05-26 DIAGNOSIS — Y9210 Unspecified residential institution as the place of occurrence of the external cause: Secondary | ICD-10-CM | POA: Insufficient documentation

## 2013-05-26 DIAGNOSIS — Z8639 Personal history of other endocrine, nutritional and metabolic disease: Secondary | ICD-10-CM | POA: Insufficient documentation

## 2013-05-26 DIAGNOSIS — D518 Other vitamin B12 deficiency anemias: Secondary | ICD-10-CM | POA: Insufficient documentation

## 2013-05-26 DIAGNOSIS — Y939 Activity, unspecified: Secondary | ICD-10-CM | POA: Insufficient documentation

## 2013-05-26 DIAGNOSIS — N183 Chronic kidney disease, stage 3 unspecified: Secondary | ICD-10-CM | POA: Insufficient documentation

## 2013-05-26 MED ORDER — TETANUS-DIPHTH-ACELL PERTUSSIS 5-2.5-18.5 LF-MCG/0.5 IM SUSP
0.5000 mL | Freq: Once | INTRAMUSCULAR | Status: AC
  Administered 2013-05-26: 0.5 mL via INTRAMUSCULAR
  Filled 2013-05-26: qty 0.5

## 2013-05-26 MED ORDER — ACETAMINOPHEN 325 MG PO TABS
650.0000 mg | ORAL_TABLET | Freq: Once | ORAL | Status: AC
  Administered 2013-05-26: 650 mg via ORAL
  Filled 2013-05-26: qty 2

## 2013-05-26 NOTE — ED Notes (Signed)
Patient transported to X-ray 

## 2013-05-26 NOTE — ED Notes (Signed)
Awaiting CT results.

## 2013-05-26 NOTE — ED Notes (Signed)
Left elbow cleaned with saline and gauze

## 2013-05-26 NOTE — ED Notes (Signed)
Patient transported to CT 

## 2013-05-26 NOTE — ED Provider Notes (Signed)
CSN: [id number]     Arrival date & time 05/26/13  2115 History   First MD Initiated Contact with Patient 05/26/13 2118     Chief Complaint  Patient presents with  . Fall   (Consider location/radiation/quality/duration/timing/severity/associated sxs/prior Treatment) HPI Comments: 79 year old male presents from a nursing facility after a fall. Patient has a history of dementia and further history is unobtainable. The nursing home nurse who takes care of him states that she found him laying at the base of his wheel chair in the room. She states the wheelchair looked like it had been toppled over like he was trying to get out of it. The patient was alert and at his normal baseline at that time. She is noticing her left wrist swelling but has been told that he has liver swelling intermittently. Patient does not normally ambulate. The patient was acting his normal baseline when she sent him by EMS. There is no new infectious symptoms recently.   Past Medical History  Diagnosis Date  . History of bladder cancer   . Dementia     With hallucinations/paranoia.  July 2013 CT brain: atrophy + small vessel ischemic demyelination  . Osteoarthritis of both knees   . Urinary incontinence   . History of non anemic vitamin B12 deficiency 08/2011 (level was 171)    Vit B12 level normal (466) 11/2011 at previous PCP.  Marland Kitchen History of hypothyroidism     currently off meds  . History of hypertension     Normotensive off meds as of 11/2011  . Chronic renal insufficiency, stage III (moderate)     Borderline stage II/III (Est CrCl about 60).  . H/O phimosis 2012/2013    Urology recommended circumcision.  Cardiology cleared him for surgery 08/09/2012.   Past Surgical History  Procedure Laterality Date  . Hernia repair    . Shoulder surgery      Right  . Cholecystectomy  1980s   Family History  Problem Relation Age of Onset  . Congestive Heart Failure Mother    History  Substance Use Topics  . Smoking  status: Never Smoker   . Smokeless tobacco: Never Used  . Alcohol Use: No    Review of Systems  Unable to perform ROS: Dementia    Allergies  Keflex  Home Medications   Current Outpatient Rx  Name  Route  Sig  Dispense  Refill  . acetaminophen (TYLENOL) 500 MG tablet   Oral   Take 1,000 mg by mouth 3 (three) times daily.         Marland Kitchen donepezil (ARICEPT) 5 MG tablet   Oral   Take 1 tablet (5 mg total) by mouth at bedtime.   90 tablet   3     Please dispense in blister packaging.   . folic acid (FOLVITE) 1 MG tablet   Oral   Take 1 mg by mouth every morning.         Marland Kitchen ipratropium (ATROVENT) 0.03 % nasal spray   Each Nare   Place 1 spray into both nostrils 3 (three) times daily.         Bertram Gala Glycol-Propyl Glycol (SYSTANE OP)   Both Eyes   Place 2 drops into both eyes 3 (three) times daily.         . vitamin B-12 (CYANOCOBALAMIN) 1000 MCG tablet   Oral   Take 1,000 mcg by mouth every morning.          BP 136/69  Pulse 85  Temp(Src) 97.9 F (36.6 C) (Oral)  Resp 18  SpO2 98% Physical Exam  Nursing note and vitals reviewed. Constitutional: He appears well-developed and well-nourished.  HENT:  Head: Normocephalic and atraumatic.  Right Ear: External ear normal.  Left Ear: External ear normal.  Nose: Nose normal.  Eyes: Right eye exhibits no discharge. Left eye exhibits no discharge.  Neck: Neck supple.  Cardiovascular: Normal rate, regular rhythm, normal heart sounds and intact distal pulses.   Pulmonary/Chest: Effort normal and breath sounds normal.  Abdominal: Soft. There is no tenderness.  Musculoskeletal:       Left elbow: He exhibits laceration (Small skin tear). Tenderness found.       Left wrist: He exhibits tenderness.       Right hip: He exhibits no tenderness.       Left hip: He exhibits no tenderness.       Left knee: Tenderness found.       Left forearm: He exhibits no tenderness.  Neurological: He is alert. He is disoriented.  GCS eye subscore is 4. GCS verbal subscore is 4. GCS motor subscore is 6.  Oriented to self and place. Disoriented on time  Skin: Skin is warm and dry.    ED Course  Procedures (including critical care time) Labs Review Labs Reviewed - No data to display Imaging Review Dg Elbow Complete Left  05/26/2013   CLINICAL DATA:  Trauma, left elbow pain  EXAM: LEFT ELBOW - COMPLETE 3+ VIEW  COMPARISON:  None.  FINDINGS: Ankle views are limited by patient's inability to extend elbow. Allowing for this, no fracture or dislocation. No joint effusion.  IMPRESSION: Limited but grossly negative study.   Electronically Signed   By: Esperanza Heir M.D.   On: 05/26/2013 22:32   Dg Wrist Complete Left  05/26/2013   CLINICAL DATA:  Fall, left wrist pain  EXAM: LEFT WRIST - COMPLETE 3+ VIEW  COMPARISON:  None.  FINDINGS: Distal radius and ulna are intact. There is across all of the images consistently atypical orientation of the triquetrum and pisiform. A fracture line is not identified.  IMPRESSION: Abnormal orientation of the triquetrum and pisiform. Cannot exclude fracture. Consider CT scan.   Electronically Signed   By: Esperanza Heir M.D.   On: 05/26/2013 22:31   Ct Head Wo Contrast  05/26/2013   CLINICAL DATA:  Fall  EXAM: CT HEAD WITHOUT CONTRAST  CT CERVICAL SPINE WITHOUT CONTRAST  TECHNIQUE: Multidetector CT imaging of the head and cervical spine was performed following the standard protocol without intravenous contrast. Multiplanar CT image reconstructions of the cervical spine were also generated.  COMPARISON:  01/10/2013  FINDINGS: CT HEAD FINDINGS  There is prominence of the sulci and ventricles consistent with brain atrophy. Mild low attenuation within the subcortical and periventricular white matter is identified compatible with chronic microvascular disease. No midline shift, ventriculomegaly, mass effect, evidence of mass lesion, intracranial hemorrhage or evidence of cortically based acute  infarction. Gray-white matter differentiation is within normal limits throughout the brain. The mastoid air cells appear clear. There is mucosal thickening involving the paranasal sinuses. Postsurgical changes involve the left maxillary sinus. The mastoid air cells are clear. The skull appears intact.  CT CERVICAL SPINE FINDINGS  The alignment of the cervical spine is normal. The facet joints are well aligned. Multilevel disc space narrowing and ventral endplate spurring is identified compatible with degenerative disc disease. No fracture or subluxation identified.  IMPRESSION: 1.  No acute intracranial abnormalities.  2. Chronic  sinus inflammation.  3. Cervical spondylosis. No evidence for cervical spine fracture or subluxation.   Electronically Signed   By: Signa Kell M.D.   On: 05/26/2013 23:34   Ct Cervical Spine Wo Contrast  05/26/2013   CLINICAL DATA:  Fall  EXAM: CT HEAD WITHOUT CONTRAST  CT CERVICAL SPINE WITHOUT CONTRAST  TECHNIQUE: Multidetector CT imaging of the head and cervical spine was performed following the standard protocol without intravenous contrast. Multiplanar CT image reconstructions of the cervical spine were also generated.  COMPARISON:  01/10/2013  FINDINGS: CT HEAD FINDINGS  There is prominence of the sulci and ventricles consistent with brain atrophy. Mild low attenuation within the subcortical and periventricular white matter is identified compatible with chronic microvascular disease. No midline shift, ventriculomegaly, mass effect, evidence of mass lesion, intracranial hemorrhage or evidence of cortically based acute infarction. Gray-white matter differentiation is within normal limits throughout the brain. The mastoid air cells appear clear. There is mucosal thickening involving the paranasal sinuses. Postsurgical changes involve the left maxillary sinus. The mastoid air cells are clear. The skull appears intact.  CT CERVICAL SPINE FINDINGS  The alignment of the cervical spine  is normal. The facet joints are well aligned. Multilevel disc space narrowing and ventral endplate spurring is identified compatible with degenerative disc disease. No fracture or subluxation identified.  IMPRESSION: 1.  No acute intracranial abnormalities.  2. Chronic sinus inflammation.  3. Cervical spondylosis. No evidence for cervical spine fracture or subluxation.   Electronically Signed   By: Signa Kell M.D.   On: 05/26/2013 23:34   Ct Wrist Left Wo Contrast  05/27/2013   CLINICAL DATA:  Status post trauma; left wrist pain. Question of abnormality on left wrist radiograph.  EXAM: CT OF THE LEFT WRIST WITHOUT CONTRAST  TECHNIQUE: Multidetector CT imaging was performed according to the standard protocol. Multiplanar CT image reconstructions were also generated.  COMPARISON:  None.  FINDINGS: A focus of cortical irregularity is again noted at the triquetrum; this is thought to reflect a focal erosion rather than a fracture. Scattered erosions are seen about the carpal rows, particularly near the 1st carpometacarpal joint. The pisiform appears grossly intact. Visualized joint spaces are preserved. Findings raise suspicion for an erosive arthritis, possibly rheumatoid arthritis. No definite fracture is seen. The distal radius and ulna appear intact. No significant soft tissue abnormalities are characterized, though evaluation is mildly suboptimal due to beam hardening artifact and the lack of contrast.  A trace pericardial effusion is noted. The visualized portions of the left lung are clear.  IMPRESSION: 1. No definite evidence of fracture. Cortical irregularity at the triquetrum is thought to reflect a focal erosion rather than a fracture. Scattered erosions about the carpal rows, suspicious for erosive osteoarthritis, possibly rheumatoid arthritis. If the patient's symptoms persist, further evaluation could be considered. 2. Trace pericardial effusion seen.   Electronically Signed   By: Roanna Raider  M.D.   On: 05/27/2013 01:08   Dg Knee Complete 4 Views Left  05/26/2013   CLINICAL DATA:  Fall, left knee pain  EXAM: LEFT KNEE - COMPLETE 4+ VIEW  COMPARISON:  None.  FINDINGS: Mild lateral and moderate medial arthritis. No fracture or dislocation. There is chondrocalcinosis. There is patellofemoral moderate arthritis. There is a small joint effusion.  IMPRESSION: Degenerative changes. No acute findings.   Electronically Signed   By: Esperanza Heir M.D.   On: 05/26/2013 22:33    EKG Interpretation   None  MDM   1. Fall at nursing home, initial encounter   2. Skin tear of elbow without complication, left, initial encounter    Daughter states patient told her he tried to get out of his wheelchair and fell. Thus syncope not indicated due to this, will get Xrays and CTs evaluated to r/o acute injury. No definite fracture on wrist, and patient can range his wrist so will discharge with symptomatic care.    Audree Camel, MD 05/27/13 1050

## 2013-05-26 NOTE — ED Notes (Signed)
Bed: WA03 Expected date:  Expected time:  Means of arrival:  Comments: EMS 79 yo M; fall; skin tear

## 2013-05-26 NOTE — ED Notes (Signed)
Per EMS: Pt is from [facility] facility. Pt had an un-witnessed fall from a wheelchair. Pt was found lying on his side in his room. Pt denies LOC, neck or back pain. Pt complains of left wrist and left elbow pain, pt has skin tear to left elbow. Pt has a history of dementia, however is A/O.

## 2013-06-01 ENCOUNTER — Other Ambulatory Visit: Payer: Self-pay | Admitting: Family Medicine

## 2013-06-01 MED ORDER — ACETAMINOPHEN-CODEINE #3 300-30 MG PO TABS: 1.0000 | ORAL_TABLET | Freq: Three times a day (TID) | ORAL | Status: DC | PRN

## 2013-06-04 ENCOUNTER — Telehealth: Payer: Self-pay | Admitting: Family Medicine

## 2013-06-04 MED ORDER — IPRATROPIUM BROMIDE 0.03 % NA SOLN: 1.0000 | Freq: Three times a day (TID) | NASAL | Status: DC

## 2013-06-04 NOTE — Telephone Encounter (Signed)
Yes, the x-rays were all unremarkable except for some erosions consistent with severe arthritis in his wrist. Nothing different to do at this time.   I can RF his ipratropium but I can't send it to the [place]. I will print rx and she'll have to get the paper rx and take care of it from that point.  Sorry.-thx

## 2013-06-04 NOTE — Telephone Encounter (Signed)
Patient's daughter called to see if you had a chance to look at the x-rays?   She also wanted to see if you could rx ipatropium to [place].  Please advise.

## 2013-06-04 NOTE — Telephone Encounter (Signed)
Patent's daughter aware.  Faxed rx to [place].

## 2013-06-08 ENCOUNTER — Other Ambulatory Visit: Payer: Self-pay | Admitting: Family Medicine

## 2013-06-08 ENCOUNTER — Emergency Department (HOSPITAL_COMMUNITY)
Admission: EM | Admit: 2013-06-08 | Discharge: 2013-06-08 | Disposition: A | Discharge status: Home / Self Care | Payer: Medicare Other | Attending: Emergency Medicine | Admitting: Emergency Medicine

## 2013-06-08 ENCOUNTER — Emergency Department (HOSPITAL_COMMUNITY): Payer: Medicare Other

## 2013-06-08 ENCOUNTER — Encounter (HOSPITAL_COMMUNITY): Payer: Self-pay | Admitting: Emergency Medicine

## 2013-06-08 DIAGNOSIS — S51809A Unspecified open wound of unspecified forearm, initial encounter: Secondary | ICD-10-CM | POA: Insufficient documentation

## 2013-06-08 DIAGNOSIS — R296 Repeated falls: Secondary | ICD-10-CM | POA: Insufficient documentation

## 2013-06-08 DIAGNOSIS — Z79899 Other long term (current) drug therapy: Secondary | ICD-10-CM | POA: Insufficient documentation

## 2013-06-08 DIAGNOSIS — I129 Hypertensive chronic kidney disease with stage 1 through stage 4 chronic kidney disease, or unspecified chronic kidney disease: Secondary | ICD-10-CM | POA: Insufficient documentation

## 2013-06-08 DIAGNOSIS — M25442 Effusion, left hand: Secondary | ICD-10-CM

## 2013-06-08 DIAGNOSIS — N186 End stage renal disease: Secondary | ICD-10-CM | POA: Insufficient documentation

## 2013-06-08 DIAGNOSIS — R609 Edema, unspecified: Secondary | ICD-10-CM | POA: Insufficient documentation

## 2013-06-08 DIAGNOSIS — F0390 Unspecified dementia without behavioral disturbance: Secondary | ICD-10-CM | POA: Insufficient documentation

## 2013-06-08 DIAGNOSIS — Y9210 Unspecified residential institution as the place of occurrence of the external cause: Secondary | ICD-10-CM | POA: Insufficient documentation

## 2013-06-08 DIAGNOSIS — IMO0002 Reserved for concepts with insufficient information to code with codable children: Secondary | ICD-10-CM

## 2013-06-08 DIAGNOSIS — Y9389 Activity, other specified: Secondary | ICD-10-CM | POA: Insufficient documentation

## 2013-06-08 DIAGNOSIS — M7989 Other specified soft tissue disorders: Secondary | ICD-10-CM

## 2013-06-08 LAB — CBC WITH DIFFERENTIAL/PLATELET
Basophils Relative: 1 % (ref 0–1)
Eosinophils Absolute: 0.4 10*3/uL (ref 0.0–0.7)
HCT: 36.9 % — ABNORMAL LOW (ref 39.0–52.0)
Hemoglobin: 12.3 g/dL — ABNORMAL LOW (ref 13.0–17.0)
Lymphocytes Relative: 15 % (ref 12–46)
Lymphs Abs: 1.2 10*3/uL (ref 0.7–4.0)
MCH: 33.2 pg (ref 26.0–34.0)
MCV: 99.7 fL (ref 78.0–100.0)
Monocytes Absolute: 1 10*3/uL (ref 0.1–1.0)
Monocytes Relative: 12 % (ref 3–12)
Neutrophils Relative %: 69 % (ref 43–77)
RBC: 3.7 MIL/uL — ABNORMAL LOW (ref 4.22–5.81)
WBC: 8.3 10*3/uL (ref 4.0–10.5)

## 2013-06-08 LAB — BASIC METABOLIC PANEL
BUN: 30 mg/dL — ABNORMAL HIGH (ref 6–23)
Creatinine, Ser: 1.34 mg/dL (ref 0.50–1.35)
GFR calc non Af Amer: 45 mL/min — ABNORMAL LOW (ref >90)
Glucose, Bld: 97 mg/dL (ref 70–99)
Potassium: 4.2 mEq/L (ref 3.5–5.1)

## 2013-06-08 MED ORDER — IPRATROPIUM BROMIDE 0.03 % NA SOLN: 1.0000 | Freq: Three times a day (TID) | NASAL | Status: DC

## 2013-06-08 NOTE — ED Provider Notes (Signed)
    Face-to-face evaluation   History: Joshua Gardner is a 79 y.o. male who is here for evaluation of ongoing swelling in the left arm. Swelling has been present for several weeks. He also had an injury to the left forearm, when his arm was accidentally bumped into a wall while being pushed in a wheelchair, several days ago. There's been no other trauma. His daughter states that he does not move either arms, much because of chronic shoulder pain.    Physical exam: Elderly man, who is alert, but nonverbal. Right arm healed elbow abrasion. Two Superficial abrasions of the forearm that her bleeding somewhat. Minimal edema of the left distal forearm and left hand. There is moderate crepitation in the left wrist with passive range of motion. He tends to hold the left wrist flexed.   Medical screening examination/treatment/procedure(s) were conducted as a shared visit with non-physician practitioner(s) and myself.  I personally evaluated the patient during the encounter   Flint Melter, MD 06/08/13 2254

## 2013-06-08 NOTE — ED Notes (Signed)
Pt having left hand and wrist swelling that started a couple of days before his fall on 11/8.  Per pt's daughter at the nursing facility he is at they have tried icing it but keeps getting more swollen.

## 2013-06-08 NOTE — Progress Notes (Signed)
*  PRELIMINARY RESULTS* Vascular Ultrasound Left upper extremity venous duplex has been completed.  Preliminary findings: no evidence of deep or superficial thrombosis.  Gave verbal report to Dr. Effie Shy.   Farrel Demark, RDMS, RVT  06/08/2013, 7:31 PM

## 2013-06-08 NOTE — ED Provider Notes (Signed)
CSN: [id number]     Arrival date & time 06/08/13  1611 History   First MD Initiated Contact with Patient 06/08/13 1645     Chief Complaint  Patient presents with  . hand swelling    (Consider location/radiation/quality/duration/timing/severity/associated sxs/prior Treatment) HPI Comments: Patient presents to the ED with a chief complaint of left arm swelling.  Patient is accompanied by his daughter, who states that the patient has had swelling in his hand for the past several weeks.  She reports a recent fall, but states that the swelling began prior to the fall.  Patient endorses mild-moderate pain.  Movement and palpation make the pain worse.  No history of DVT.  No fevers, chills, nausea, or vomiting.  The history is provided by the patient. No language interpreter was used.    Past Medical History  Diagnosis Date  . History of bladder cancer   . Dementia     With hallucinations/paranoia.  July 2013 CT brain: atrophy + small vessel ischemic demyelination  . Osteoarthritis of both knees   . Urinary incontinence   . History of non anemic vitamin B12 deficiency 08/2011 (level was 171)    Vit B12 level normal (466) 11/2011 at previous PCP.  Marland Kitchen History of hypothyroidism     currently off meds  . History of hypertension     Normotensive off meds as of 11/2011  . Chronic renal insufficiency, stage III (moderate)     Borderline stage II/III (Est CrCl about 60).  . H/O phimosis 2012/2013    Urology recommended circumcision.  Cardiology cleared him for surgery 08/09/2012.   Past Surgical History  Procedure Laterality Date  . Hernia repair    . Shoulder surgery      Right  . Cholecystectomy  1980s   Family History  Problem Relation Age of Onset  . Congestive Heart Failure Mother    History  Substance Use Topics  . Smoking status: Never Smoker   . Smokeless tobacco: Never Used  . Alcohol Use: No    Review of Systems  All other systems reviewed and are negative.    Allergies   Keflex and Tramadol  Home Medications   Current Outpatient Rx  Name  Route  Sig  Dispense  Refill  . acetaminophen (TYLENOL) 500 MG tablet   Oral   Take 1,000 mg by mouth 3 (three) times daily.         Marland Kitchen donepezil (ARICEPT) 5 MG tablet   Oral   Take 1 tablet (5 mg total) by mouth at bedtime.   90 tablet   3     Please dispense in blister packaging.   . folic acid (FOLVITE) 1 MG tablet   Oral   Take 1 mg by mouth every morning.         Marland Kitchen ipratropium (ATROVENT) 0.03 % nasal spray   Each Nare   Place 1 spray into both nostrils 3 (three) times daily.   30 mL   6   . Polyethyl Glycol-Propyl Glycol (SYSTANE OP)   Both Eyes   Place 2 drops into both eyes 3 (three) times daily.         . vitamin B-12 (CYANOCOBALAMIN) 1000 MCG tablet   Oral   Take 1,000 mcg by mouth every morning.         Marland Kitchen acetaminophen-codeine (TYLENOL #3) 300-30 MG per tablet   Oral   Take 1-2 tablets by mouth every 8 (eight) hours as needed for moderate pain.  90 tablet   0    BP 140/84  Pulse 73  Temp(Src) 97.7 F (36.5 C) (Oral)  Resp 18  SpO2 96% Physical Exam  Nursing note and vitals reviewed. Constitutional: He is oriented to person, place, and time. He appears well-developed and well-nourished.  HENT:  Head: Normocephalic and atraumatic.  Neck: Normal range of motion. Neck supple. No JVD present.  Cardiovascular: Normal rate, regular rhythm, normal heart sounds and intact distal pulses.  Exam reveals no gallop and no friction rub.   No murmur heard. Intact distal pulses  Pulmonary/Chest: Effort normal and breath sounds normal. No respiratory distress. He has no wheezes. He has no rales. He exhibits no tenderness.  Abdominal: Soft. He exhibits no distension.  Musculoskeletal: He exhibits edema. He exhibits no tenderness.  Left hand and forearm swelling, 2+ edema, ROM an strength is slightly diminished, no palpable bony abnormality or deformity, crepitus in left shoulder   Neurological: He is alert and oriented to person, place, and time.  Skin: Skin is warm and dry.  Thin skin, with multiple skin tears on left arm  Psychiatric: He has a normal mood and affect. His behavior is normal. Judgment and thought content normal.    ED Course  Procedures (including critical care time) Results for orders placed during the hospital encounter of 06/08/13  CBC WITH DIFFERENTIAL      Result Value Range   WBC 8.3  4.0 - 10.5 K/uL   RBC 3.70 (*) 4.22 - 5.81 MIL/uL   Hemoglobin 12.3 (*) 13.0 - 17.0 g/dL   HCT 95.2 (*) 84.1 - 32.4 %   MCV 99.7  78.0 - 100.0 fL   MCH 33.2  26.0 - 34.0 pg   MCHC 33.3  30.0 - 36.0 g/dL   RDW 40.1  02.7 - 25.3 %   Platelets 213  150 - 400 K/uL   Neutrophils Relative % 69  43 - 77 %   Neutro Abs 5.7  1.7 - 7.7 K/uL   Lymphocytes Relative 15  12 - 46 %   Lymphs Abs 1.2  0.7 - 4.0 K/uL   Monocytes Relative 12  3 - 12 %   Monocytes Absolute 1.0  0.1 - 1.0 K/uL   Eosinophils Relative 4  0 - 5 %   Eosinophils Absolute 0.4  0.0 - 0.7 K/uL   Basophils Relative 1  0 - 1 %   Basophils Absolute 0.1  0.0 - 0.1 K/uL  BASIC METABOLIC PANEL      Result Value Range   Sodium 143  135 - 145 mEq/L   Potassium 4.2  3.5 - 5.1 mEq/L   Chloride 109  96 - 112 mEq/L   CO2 26  19 - 32 mEq/L   Glucose, Bld 97  70 - 99 mg/dL   BUN 30 (*) 6 - 23 mg/dL   Creatinine, Ser 6.64  0.50 - 1.35 mg/dL   Calcium 9.5  8.4 - 40.3 mg/dL   GFR calc non Af Amer 45 (*) >90 mL/min   GFR calc Af Amer 52 (*) >90 mL/min   Dg Elbow Complete Left  06/08/2013   CLINICAL DATA:  Fall.  Left elbow pain, swelling.  EXAM: LEFT ELBOW - COMPLETE 3+ VIEW  COMPARISON:  None.  FINDINGS: No acute bony abnormality. Specifically, no fracture, subluxation, or dislocation. Soft tissues are intact. No joint effusion  IMPRESSION: No acute bony abnormality.   Electronically Signed   By: Charlett Nose M.D.   On: 06/08/2013  17:53   Dg Elbow Complete Left  05/26/2013   CLINICAL DATA:  Trauma,  left elbow pain  EXAM: LEFT ELBOW - COMPLETE 3+ VIEW  COMPARISON:  None.  FINDINGS: Ankle views are limited by patient's inability to extend elbow. Allowing for this, no fracture or dislocation. No joint effusion.  IMPRESSION: Limited but grossly negative study.   Electronically Signed   By: Esperanza Heir M.D.   On: 05/26/2013 22:32   Dg Wrist Complete Left  06/08/2013   CLINICAL DATA:  Fall. Swelling throughout the left wrist and hand. Generalized pain.  EXAM: LEFT WRIST - COMPLETE 3+ VIEW  COMPARISON:  Hand series performed today. Prior plain films and CT performed 05/26/2013  FINDINGS: Arthritic changes are again noted within the left wrist as seen on prior plain films and CT. I see no acute fracture, subluxation or dislocation. Mild generalized osteopenia.  IMPRESSION: No acute bony abnormality.  No change since prior study.   Electronically Signed   By: Charlett Nose M.D.   On: 06/08/2013 17:52   Dg Wrist Complete Left  05/26/2013   CLINICAL DATA:  Fall, left wrist pain  EXAM: LEFT WRIST - COMPLETE 3+ VIEW  COMPARISON:  None.  FINDINGS: Distal radius and ulna are intact. There is across all of the images consistently atypical orientation of the triquetrum and pisiform. A fracture line is not identified.  IMPRESSION: Abnormal orientation of the triquetrum and pisiform. Cannot exclude fracture. Consider CT scan.   Electronically Signed   By: Esperanza Heir M.D.   On: 05/26/2013 22:31   Ct Head Wo Contrast  05/26/2013   CLINICAL DATA:  Fall  EXAM: CT HEAD WITHOUT CONTRAST  CT CERVICAL SPINE WITHOUT CONTRAST  TECHNIQUE: Multidetector CT imaging of the head and cervical spine was performed following the standard protocol without intravenous contrast. Multiplanar CT image reconstructions of the cervical spine were also generated.  COMPARISON:  01/10/2013  FINDINGS: CT HEAD FINDINGS  There is prominence of the sulci and ventricles consistent with brain atrophy. Mild low attenuation within the subcortical  and periventricular white matter is identified compatible with chronic microvascular disease. No midline shift, ventriculomegaly, mass effect, evidence of mass lesion, intracranial hemorrhage or evidence of cortically based acute infarction. Gray-white matter differentiation is within normal limits throughout the brain. The mastoid air cells appear clear. There is mucosal thickening involving the paranasal sinuses. Postsurgical changes involve the left maxillary sinus. The mastoid air cells are clear. The skull appears intact.  CT CERVICAL SPINE FINDINGS  The alignment of the cervical spine is normal. The facet joints are well aligned. Multilevel disc space narrowing and ventral endplate spurring is identified compatible with degenerative disc disease. No fracture or subluxation identified.  IMPRESSION: 1.  No acute intracranial abnormalities.  2. Chronic sinus inflammation.  3. Cervical spondylosis. No evidence for cervical spine fracture or subluxation.   Electronically Signed   By: Signa Kell M.D.   On: 05/26/2013 23:34   Ct Cervical Spine Wo Contrast  05/26/2013   CLINICAL DATA:  Fall  EXAM: CT HEAD WITHOUT CONTRAST  CT CERVICAL SPINE WITHOUT CONTRAST  TECHNIQUE: Multidetector CT imaging of the head and cervical spine was performed following the standard protocol without intravenous contrast. Multiplanar CT image reconstructions of the cervical spine were also generated.  COMPARISON:  01/10/2013  FINDINGS: CT HEAD FINDINGS  There is prominence of the sulci and ventricles consistent with brain atrophy. Mild low attenuation within the subcortical and periventricular white matter is identified compatible with  chronic microvascular disease. No midline shift, ventriculomegaly, mass effect, evidence of mass lesion, intracranial hemorrhage or evidence of cortically based acute infarction. Gray-white matter differentiation is within normal limits throughout the brain. The mastoid air cells appear clear. There is  mucosal thickening involving the paranasal sinuses. Postsurgical changes involve the left maxillary sinus. The mastoid air cells are clear. The skull appears intact.  CT CERVICAL SPINE FINDINGS  The alignment of the cervical spine is normal. The facet joints are well aligned. Multilevel disc space narrowing and ventral endplate spurring is identified compatible with degenerative disc disease. No fracture or subluxation identified.  IMPRESSION: 1.  No acute intracranial abnormalities.  2. Chronic sinus inflammation.  3. Cervical spondylosis. No evidence for cervical spine fracture or subluxation.   Electronically Signed   By: Signa Kell M.D.   On: 05/26/2013 23:34   Ct Wrist Left Wo Contrast  05/27/2013   CLINICAL DATA:  Status post trauma; left wrist pain. Question of abnormality on left wrist radiograph.  EXAM: CT OF THE LEFT WRIST WITHOUT CONTRAST  TECHNIQUE: Multidetector CT imaging was performed according to the standard protocol. Multiplanar CT image reconstructions were also generated.  COMPARISON:  None.  FINDINGS: A focus of cortical irregularity is again noted at the triquetrum; this is thought to reflect a focal erosion rather than a fracture. Scattered erosions are seen about the carpal rows, particularly near the 1st carpometacarpal joint. The pisiform appears grossly intact. Visualized joint spaces are preserved. Findings raise suspicion for an erosive arthritis, possibly rheumatoid arthritis. No definite fracture is seen. The distal radius and ulna appear intact. No significant soft tissue abnormalities are characterized, though evaluation is mildly suboptimal due to beam hardening artifact and the lack of contrast.  A trace pericardial effusion is noted. The visualized portions of the left lung are clear.  IMPRESSION: 1. No definite evidence of fracture. Cortical irregularity at the triquetrum is thought to reflect a focal erosion rather than a fracture. Scattered erosions about the carpal  rows, suspicious for erosive osteoarthritis, possibly rheumatoid arthritis. If the patient's symptoms persist, further evaluation could be considered. 2. Trace pericardial effusion seen.   Electronically Signed   By: Roanna Raider M.D.   On: 05/27/2013 01:08   Dg Knee Complete 4 Views Left  05/26/2013   CLINICAL DATA:  Fall, left knee pain  EXAM: LEFT KNEE - COMPLETE 4+ VIEW  COMPARISON:  None.  FINDINGS: Mild lateral and moderate medial arthritis. No fracture or dislocation. There is chondrocalcinosis. There is patellofemoral moderate arthritis. There is a small joint effusion.  IMPRESSION: Degenerative changes. No acute findings.   Electronically Signed   By: Esperanza Heir M.D.   On: 05/26/2013 22:33   Dg Hand Complete Left  06/08/2013   CLINICAL DATA:  Fall with extensive soft tissue swelling wrist and hand  EXAM: LEFT HAND - COMPLETE 3+ VIEW  COMPARISON:  None.  FINDINGS: Diffuse osteopenia. Diffuse osteoarthritis of the interphalangeal joints. Diffuse soft tissue swelling. No fracture or dislocation.  IMPRESSION: No fracture identified.   Electronically Signed   By: Esperanza Heir M.D.   On: 06/08/2013 17:46     EKG Interpretation   None     *PRELIMINARY RESULTS*  Vascular Ultrasound  Left upper extremity venous duplex has been completed. Preliminary findings: no evidence of deep or superficial thrombosis.  Gave verbal report to Dr. Effie Shy.      MDM   1. Swelling of hand joint, left   2. Skin tear  Patient with swelling of left hand.  Will recheck plain films.  Consider [place] for DVT rule out.   8:46 PM Patient seen by and discussed with Dr. Effie Shy, who tells me to put the patient in PRN cock=up splint for swelling.  DVT study is negative.  Wound care ordered.  DC to home.  Return precautions given.  Follow-up with PCP.  Patient is stable and ready for discharge.     Roxy Horseman, PA-C 06/08/13 2048

## 2013-06-08 NOTE — ED Notes (Signed)
When lifted pt's sleeve pt has dressing on left lower arm that is wrapped tightly that daughter is unaware of.

## 2013-06-08 NOTE — Telephone Encounter (Signed)
Patient's daughter called wanting nasal spray faxed to [facility] pharmacy for maintenance use.

## 2013-06-12 DIAGNOSIS — F0390 Unspecified dementia without behavioral disturbance: Secondary | ICD-10-CM

## 2013-06-12 DIAGNOSIS — N186 End stage renal disease: Secondary | ICD-10-CM

## 2013-06-13 ENCOUNTER — Encounter: Payer: Self-pay | Admitting: Family Medicine

## 2013-06-18 ENCOUNTER — Telehealth: Payer: Self-pay | Admitting: Family Medicine

## 2013-06-18 NOTE — Telephone Encounter (Signed)
Patient was assesed by Hospice on Wednesday and he will be placed. His daughter has requested to pull the DNR for the patient until she can speak with her brother. Right now Eunice Blase will visit with Mr. Joshua Gardner 2x monthly with PRN visits available, she will also be meeting with his family for end of life care/discussions.

## 2013-06-18 NOTE — Telephone Encounter (Signed)
Please advise 

## 2013-06-27 ENCOUNTER — Telehealth: Payer: Self-pay | Admitting: Family Medicine

## 2013-06-27 MED ORDER — PREDNISONE 20 MG PO TABS: ORAL_TABLET | ORAL | Status: DC

## 2013-06-27 NOTE — Telephone Encounter (Signed)
Spoke with on call nurse at hospice.  She stated to fax the RX to [phone] and she will take care of the rest.

## 2013-06-27 NOTE — Telephone Encounter (Signed)
A splint was dispensed to him at his most recent ED visit (per ED records).  However, when the wrist/hand is so swollen, I would rather not compress it with a splint. I will rx prednisone for a 15 day taper.  Pls see if I should eRx it or what.--thx

## 2013-06-27 NOTE — Telephone Encounter (Signed)
Prednisone rx printed.

## 2013-06-27 NOTE — Telephone Encounter (Signed)
Patient's left hand is swelling, 3+ edema.  Nurse from hospice wanted to see if he should be wearing a splint?  Also, can he get anything else for pain or maybe an antiinflammatory?  Please advise.

## 2013-06-28 ENCOUNTER — Other Ambulatory Visit: Payer: Self-pay | Admitting: Family Medicine

## 2013-06-28 MED ORDER — PREDNISONE 20 MG PO TABS: ORAL_TABLET | ORAL | Status: DC

## 2013-06-28 NOTE — Telephone Encounter (Signed)
Hospice called stating to please refax rx to pharmacy at [phone]

## 2013-07-03 ENCOUNTER — Other Ambulatory Visit (INDEPENDENT_AMBULATORY_CARE_PROVIDER_SITE_OTHER): Payer: Medicare Other

## 2013-07-03 ENCOUNTER — Ambulatory Visit (INDEPENDENT_AMBULATORY_CARE_PROVIDER_SITE_OTHER): Payer: Medicare Other | Admitting: Family Medicine

## 2013-07-03 ENCOUNTER — Encounter: Payer: Self-pay | Admitting: Family Medicine

## 2013-07-03 VITALS — BP 205/108 | HR 81 | Temp 98.1°F | Resp 16

## 2013-07-03 DIAGNOSIS — M19032 Primary osteoarthritis, left wrist: Secondary | ICD-10-CM | POA: Insufficient documentation

## 2013-07-03 DIAGNOSIS — M19039 Primary osteoarthritis, unspecified wrist: Secondary | ICD-10-CM

## 2013-07-03 DIAGNOSIS — N182 Chronic kidney disease, stage 2 (mild): Secondary | ICD-10-CM

## 2013-07-03 DIAGNOSIS — E441 Mild protein-calorie malnutrition: Secondary | ICD-10-CM

## 2013-07-03 DIAGNOSIS — M25442 Effusion, left hand: Secondary | ICD-10-CM

## 2013-07-03 DIAGNOSIS — R739 Hyperglycemia, unspecified: Secondary | ICD-10-CM | POA: Insufficient documentation

## 2013-07-03 DIAGNOSIS — M25449 Effusion, unspecified hand: Secondary | ICD-10-CM

## 2013-07-03 DIAGNOSIS — E8809 Other disorders of plasma-protein metabolism, not elsewhere classified: Secondary | ICD-10-CM

## 2013-07-03 LAB — COMPREHENSIVE METABOLIC PANEL
AST: 20 U/L (ref 0–37)
Albumin: 3.7 g/dL (ref 3.5–5.2)
BUN: 27 mg/dL — ABNORMAL HIGH (ref 6–23)
CO2: 22 mEq/L (ref 19–32)
Calcium: 9.7 mg/dL (ref 8.4–10.5)
Chloride: 107 mEq/L (ref 96–112)
Creatinine, Ser: 1.2 mg/dL (ref 0.4–1.5)
GFR: 58.3 mL/min — ABNORMAL LOW (ref >60.00)
Glucose, Bld: 212 mg/dL — ABNORMAL HIGH (ref 70–99)
Potassium: 4.1 mEq/L (ref 3.5–5.1)

## 2013-07-03 MED ORDER — LISINOPRIL 10 MG PO TABS: 10.0000 mg | ORAL_TABLET | Freq: Every day | ORAL | Status: DC

## 2013-07-03 NOTE — Progress Notes (Signed)
Pre visit review using our clinic review tool, if applicable. No additional management support is needed unless otherwise documented below in the visit note. OFFICE NOTE  07/03/2013  CC:  Chief Complaint  Patient presents with  . Follow-up     HPI: [name] who is here for 4 mo f/u advanced dementia, failure to thrive, wheelchair-bound secondary to FTT and bilat osteo of knees.  I recently started him on prednisone for recurrent left wrist/hand inflamm arthritis with diffuse left hand edema--about 1 wk ago.  The swelling seems quite a bit better. A left arm DVT was ruled out by venous doppler.   Rarely uses tylenol w/codeine.   About 2 wks ago he got both knees and left shoulder injected with steroids.  This has helped. Lives at [facility]; eats fairly well per daughter but he drinks lots of coffee instead of water.  Pertinent PMH:  Past Medical History  Diagnosis Date  . History of bladder cancer   . Dementia     With hallucinations/paranoia.  July 2013 CT brain: atrophy + small vessel ischemic demyelination  . Osteoarthritis of both knees   . Urinary incontinence   . History of non anemic vitamin B12 deficiency 08/2011 (level was 171)    Vit B12 level normal (466) 11/2011 at previous PCP.  Marland Kitchen History of hypothyroidism     currently off meds  . History of hypertension     Normotensive off meds as of 11/2011  . Chronic renal insufficiency, stage III (moderate)     Borderline stage II/III (Est CrCl about 60).  . H/O phimosis 2012/2013    Urology recommended circumcision.  Cardiology cleared him for surgery 08/09/2012.  . 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 Hernia repair    . Shoulder surgery      Right  . Cholecystectomy  1980s  . Venous duplex doppler of left upper extremity  05/2013    NEG for DVT or superficial venous thrombosis.     MEDS:  Outpatient Prescriptions Prior to Visit  Medication Sig Dispense Refill  . acetaminophen (TYLENOL) 500 MG tablet Take 1,000 mg by mouth 3 (three) times daily.      Marland Kitchen acetaminophen-codeine (TYLENOL #3) 300-30 MG per tablet Take 1-2 tablets by mouth every 8 (eight) hours as needed for moderate pain.  90 tablet  0  . donepezil (ARICEPT) 5 MG tablet Take 1 tablet (5 mg total) by mouth at bedtime.  90 tablet  3  . folic acid (FOLVITE) 1 MG tablet Take 1 mg by mouth every morning.      Marland Kitchen ipratropium (ATROVENT) 0.03 % nasal spray Place 1 spray into both nostrils 3 (three) times daily.  30 mL  6  . Polyethyl Glycol-Propyl Glycol (SYSTANE OP) Place 2 drops into both eyes 3 (three) times daily.      . predniSONE (DELTASONE) 20 MG tablet 2 tabs po qd x 5d, then 1 tab po qd x 5d, then 1/2 tab po qd x 4d  17 tablet  0  . vitamin B-12 (CYANOCOBALAMIN) 1000 MCG tablet Take 1,000 mcg by mouth every morning.       No facility-administered medications prior to visit.    PE: Blood pressure 205/108, pulse 81, temperature 98.1 F (36.7 C), temperature source Temporal, resp. rate 16, SpO2 98.00%. Gen: alert, sitting in wheelchair.  Oriented to person  and place only.  NAD. Neuro: no focal deficit.  He has very slight resting tremor R arm >L arm---no "pill rolling" type tremor.  CV: RRR LUNGS: CTA bilat, nonlabored resps. EXT: 2+ pitting edema bilat. Question of trace pitting edema to skin on trunk and arms as well.   IMPRESSION AND PLAN:  1) HTN: secondary to prednisone (3 joint injections relatively recently plus the prednisone I started last week) most likely.  Stop prednisone.  Start lisinopril 10mg  qd.   Sent order to his NH to check bp bid. CMET today.  2) Left wrist arthritis, uncertain etiology.  Discussed problem with daughter and we have decided to take a watchful waiting approach at this point and treat symptomatically--he has tylenol w/codeine to use prn.  3) Advanced dementia.   Unchanged/stable.  Continue aricept for now but he can likely get off of this med in near future w/out any problems.   It is doubtful that it is helping him any at this point.  4) mild malnutrition suspected (question hypoalbuminemia). CMET today.  Start 1 can ensure bid.  FOLLOW UP: 1 wk

## 2013-07-04 ENCOUNTER — Ambulatory Visit: Payer: Medicare Other | Admitting: Family Medicine

## 2013-07-10 ENCOUNTER — Encounter: Payer: Self-pay | Admitting: Family Medicine

## 2013-07-10 ENCOUNTER — Ambulatory Visit (INDEPENDENT_AMBULATORY_CARE_PROVIDER_SITE_OTHER): Payer: Medicare Other | Admitting: Family Medicine

## 2013-07-10 VITALS — BP 169/93 | HR 71 | Temp 98.0°F | Resp 16

## 2013-07-10 DIAGNOSIS — M19032 Primary osteoarthritis, left wrist: Secondary | ICD-10-CM

## 2013-07-10 DIAGNOSIS — T380X5D Adverse effect of glucocorticoids and synthetic analogues, subsequent encounter: Secondary | ICD-10-CM

## 2013-07-10 DIAGNOSIS — M19039 Primary osteoarthritis, unspecified wrist: Secondary | ICD-10-CM

## 2013-07-10 DIAGNOSIS — R739 Hyperglycemia, unspecified: Secondary | ICD-10-CM

## 2013-07-10 DIAGNOSIS — M25449 Effusion, unspecified hand: Secondary | ICD-10-CM

## 2013-07-10 DIAGNOSIS — R7309 Other abnormal glucose: Secondary | ICD-10-CM

## 2013-07-10 DIAGNOSIS — I10 Essential (primary) hypertension: Secondary | ICD-10-CM

## 2013-07-10 DIAGNOSIS — Z5189 Encounter for other specified aftercare: Secondary | ICD-10-CM

## 2013-07-10 DIAGNOSIS — M25442 Effusion, left hand: Secondary | ICD-10-CM

## 2013-07-10 LAB — GLUCOSE, POCT (MANUAL RESULT ENTRY): POC Glucose: 101 mg/dl — AB (ref 70–99)

## 2013-07-10 NOTE — Progress Notes (Signed)
OFFICE NOTE  07/10/2013  CC:  Chief Complaint  Patient presents with  . Follow-up     HPI: [name] who is here for 1 wk f/u HTN believed to be secondary to prednisone, left wrist/hand swelling/arthritis, and advanced dementia.  Also, CMET after that visit showed glucose >200.  HbA1c was unable to be added.  I think this hyperglycemia was a prednisone effect as well. Daughter says she doubts bp checks or elevation of pt's hand were done at his NH.  I received no bp reports from them. He hasn't been complaining of any pain, but his left hand swelling has returned since stopping prednisone.  No redness of left wrist or fingers.  Has problem with cerumen impactions, daughter asks if I'll take a look and clean them out.     Pertinent PMH:  Past Medical History  Diagnosis Date  . History of bladder cancer   . Dementia     With hallucinations/paranoia.  July 2013 CT brain: atrophy + small vessel ischemic demyelination  . Osteoarthritis of both knees   . Urinary incontinence   . History of non anemic vitamin B12 deficiency 08/2011 (level was 171)    Vit B12 level normal (466) 11/2011 at previous PCP.  Marland Kitchen History of hypothyroidism     currently off meds  . History of hypertension     Normotensive off meds as of 11/2011  . Chronic renal insufficiency, stage III (moderate)     Borderline stage II/III (Est CrCl about 60).  . H/O phimosis 2012/2013    Urology recommended circumcision.  Cardiology cleared him for surgery 08/09/2012.  . 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 acetaminophen (TYLENOL) 500 MG tablet Take 1,000 mg by mouth 3 (three) times daily.      Marland Kitchen acetaminophen-codeine (TYLENOL #3) 300-30 MG per tablet Take 1-2 tablets by mouth every 8 (eight) hours as needed for moderate pain.  90 tablet  0  .  donepezil (ARICEPT) 5 MG tablet Take 1 tablet (5 mg total) by mouth at bedtime.  90 tablet  3  . folic acid (FOLVITE) 1 MG tablet Take 1 mg by mouth every morning.      Marland Kitchen ipratropium (ATROVENT) 0.03 % nasal spray Place 1 spray into both nostrils 3 (three) times daily.  30 mL  6  . lisinopril (PRINIVIL,ZESTRIL) 10 MG tablet Take 1 tablet (10 mg total) by mouth daily.  30 tablet  1  . Polyethyl Glycol-Propyl Glycol (SYSTANE OP) Place 2 drops into both eyes 3 (three) times daily.      . vitamin B-12 (CYANOCOBALAMIN) 1000 MCG tablet Take 1,000 mcg by mouth every morning.       No facility-administered medications prior to visit.    PE: Blood pressure 169/93, pulse 71, temperature 98 F (36.7 C), temperature source Temporal, resp. rate 16, SpO2 98.00%. Gen: alert, attends well and answers simple questions fine.  Oriented to person and place.  NAD. EARS: bilateral deep and complete cerumen impactions. CV: RRR LUNGS: CTA Left wrist with mild warmth compared to right wrist but minimal, if any, swelling of wrist.  No erythema. Left hand is diffusely swollen but without erythema or warmth.  He flexes and extends his left wrist much slower than his right.  Flexes and extends fingers slower in left hand than right.  No significant left wrist or hand tenderness. LEGS: stable bilat 2+ pitting edema.  LAB: capillary glucose today was 101   IMPRESSION AND PLAN:  1) HTN: secondary to recent systemic steroids. Improving off steroids.  Continue lisinopril 10mg  qd for now. Recheck BMET at next f/u in 94mo.  2) Left wrist/hand arthritis: watchful waiting/symptomatic care.  3) Recent hyperglycemia: also a steroid effect.  Resolved.  4) Bilateral cerumen impactions: these are too deep and dense for me to attempt curette or irrigation removal today.  I recommended we have ENT see him to remove this but his daughter wants to try their audiologist's office first.  FOLLOW UP: 1 mo

## 2013-07-10 NOTE — Progress Notes (Signed)
Pre visit review using our clinic review tool, if applicable. No additional management support is needed unless otherwise documented below in the visit note. 

## 2013-07-13 ENCOUNTER — Telehealth: Payer: Self-pay | Admitting: Family Medicine

## 2013-07-13 NOTE — Telephone Encounter (Signed)
This is an Financial planner from Hospice. Patient daughter called Hospice & wanted someone to go out for PRN visit because she was notified there was a possible med error. When nurse got to facility she SW the med tech who reports there was no med error but there is a change in patient's condition. He is congested throughout, lethargic, his vital signs are normal. He is aggressive & agitated which is not his normal status. Patient did refuse lunch today. The daughter has refused to send him to ER which is what the facility wants to do. She is on her way to the facility to decide what to do.

## 2013-07-16 MED ORDER — HYDROCODONE-HOMATROPINE 5-1.5 MG/5ML PO SYRP: ORAL_SOLUTION | ORAL | Status: DC

## 2013-07-16 NOTE — Telephone Encounter (Signed)
Patient's daughter called. She went to fill Tussinex Rx. Medicare does not cover the drug, it would have been over $100 to fill. Is there anything else that the patient could take? She said he is still rattling.

## 2013-07-16 NOTE — Telephone Encounter (Signed)
Please advise 

## 2013-07-16 NOTE — Telephone Encounter (Signed)
Patient daughter called in regarding this. She states that best # to reach her at is [phone]

## 2013-07-16 NOTE — Telephone Encounter (Signed)
Pls notify daughter of hycodan rx printed--to pick up.

## 2013-07-17 NOTE — Telephone Encounter (Signed)
Spoke with Patient's daughter and she will pick up rx.

## 2013-07-23 ENCOUNTER — Telehealth: Payer: Self-pay | Admitting: Family Medicine

## 2013-07-23 NOTE — Telephone Encounter (Signed)
Patient's daughter called stating that the pt is allergic to hydrocodone and can not take the cough medication.  Patient's daughter started him on mucinex and [facility] to [facility] him this medication.   Please advise.

## 2013-07-23 NOTE — Telephone Encounter (Signed)
Rx written.

## 2013-07-24 ENCOUNTER — Telehealth: Payer: Self-pay | Admitting: Family Medicine

## 2013-07-24 ENCOUNTER — Other Ambulatory Visit: Payer: Self-pay | Admitting: Family Medicine

## 2013-07-24 MED ORDER — ERYTHROMYCIN 5 MG/GM OP OINT: TOPICAL_OINTMENT | OPHTHALMIC | Status: DC

## 2013-07-24 NOTE — Telephone Encounter (Signed)
[facility]

## 2013-07-24 NOTE — Telephone Encounter (Signed)
Shelia from Hospice called today stating that Pt's left eye is red, inflamed, and draining.  Hospice was requesting an antibiotic ointment?  Please advise.

## 2013-07-24 NOTE — Telephone Encounter (Signed)
Rx sent to pharmacy for erythromycin ointment.  Pls fax his ALF my written order.--thx

## 2013-08-02 DIAGNOSIS — N183 Chronic kidney disease, stage 3 unspecified: Secondary | ICD-10-CM | POA: Insufficient documentation

## 2013-08-10 ENCOUNTER — Ambulatory Visit (INDEPENDENT_AMBULATORY_CARE_PROVIDER_SITE_OTHER): Payer: Medicare Other | Admitting: Family Medicine

## 2013-08-10 ENCOUNTER — Encounter: Payer: Self-pay | Admitting: Family Medicine

## 2013-08-10 VITALS — BP 171/73 | HR 65 | Temp 97.8°F | Resp 16

## 2013-08-10 DIAGNOSIS — N183 Chronic kidney disease, stage 3 unspecified: Secondary | ICD-10-CM

## 2013-08-10 DIAGNOSIS — I10 Essential (primary) hypertension: Secondary | ICD-10-CM

## 2013-08-10 DIAGNOSIS — N189 Chronic kidney disease, unspecified: Secondary | ICD-10-CM

## 2013-08-10 DIAGNOSIS — IMO0002 Reserved for concepts with insufficient information to code with codable children: Secondary | ICD-10-CM

## 2013-08-10 DIAGNOSIS — F03918 Unspecified dementia, unspecified severity, with other behavioral disturbance: Secondary | ICD-10-CM

## 2013-08-10 DIAGNOSIS — F0391 Unspecified dementia with behavioral disturbance: Secondary | ICD-10-CM

## 2013-08-10 DIAGNOSIS — M25442 Effusion, left hand: Secondary | ICD-10-CM

## 2013-08-10 DIAGNOSIS — M25449 Effusion, unspecified hand: Secondary | ICD-10-CM

## 2013-08-10 DIAGNOSIS — J209 Acute bronchitis, unspecified: Secondary | ICD-10-CM

## 2013-08-10 LAB — BASIC METABOLIC PANEL
BUN: 28 mg/dL — AB (ref 6–23)
CALCIUM: 9.3 mg/dL (ref 8.4–10.5)
CHLORIDE: 110 meq/L (ref 96–112)
CO2: 26 meq/L (ref 19–32)
CREATININE: 1.2 mg/dL (ref 0.4–1.5)
GFR: 62.33 mL/min (ref >60.00)
GLUCOSE: 72 mg/dL (ref 70–99)
Potassium: 4.4 mEq/L (ref 3.5–5.1)
Sodium: 142 mEq/L (ref 135–145)

## 2013-08-10 NOTE — Assessment & Plan Note (Signed)
Improved.  Will have much more lenient BP goal in him. Continue lisinopril 10mg  qd, recheck BMET here today.

## 2013-08-10 NOTE — Progress Notes (Signed)
OFFICE NOTE  08/10/2013  CC:  Chief Complaint  Patient presents with  . Follow-up    seems to be more confused than normal     HPI: Patient is a 80 y.o. Caucasian male who is here for 1 mo f/u dementia, recent bp increase associated with systemic steroids. Audiologist cleaned ears out since last visit.   Hospice support daily, hospice nurse 2X /week---go to his ALF.  Minimal cough now.     Pertinent PMH:  Past Medical History  Diagnosis Date  . History of bladder cancer   . Dementia     With hallucinations/paranoia.  July 2013 CT brain: atrophy + small vessel ischemic demyelination  . Osteoarthritis of both knees   . Urinary incontinence   . History of non anemic vitamin B12 deficiency 08/2011 (level was 171)    Vit B12 level normal (466) 11/2011 at previous PCP.  Marland Kitchen History of hypothyroidism     currently off meds  . History of hypertension     Normotensive off meds as of 11/2011  . Chronic renal insufficiency, stage III (moderate)     Borderline stage II/III (Est CrCl about 60).  . H/O phimosis 2012/2013    Urology recommended circumcision.  Cardiology cleared him for surgery 08/09/2012.  . 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 Hernia repair    . Shoulder surgery      Right  . Cholecystectomy  1980s  . Venous duplex doppler of left upper extremity  05/2013    NEG for DVT or superficial venous thrombosis.    MEDS:  Outpatient Prescriptions Prior to Visit  Medication Sig Dispense Refill  . acetaminophen (TYLENOL) 500 MG tablet Take 1,000 mg by mouth 3 (three) times daily.      Marland Kitchen acetaminophen-codeine (TYLENOL #3) 300-30 MG per tablet Take 1-2 tablets by mouth every 8 (eight) hours as needed for moderate pain.  90 tablet  0  . donepezil (ARICEPT) 5 MG tablet Take 1 tablet (5 mg total) by mouth at bedtime.  90 tablet  3  . folic acid (FOLVITE) 1 MG  tablet Take 1 mg by mouth every morning.      Marland Kitchen HYDROcodone-homatropine (HYCODAN) 5-1.5 MG/5ML syrup 1 tsp po q8h prn cough  120 mL  0  . ipratropium (ATROVENT) 0.03 % nasal spray Place 1 spray into both nostrils 3 (three) times daily.  30 mL  6  . lisinopril (PRINIVIL,ZESTRIL) 10 MG tablet Take 1 tablet (10 mg total) by mouth daily.  30 tablet  1  . Polyethyl Glycol-Propyl Glycol (SYSTANE OP) Place 2 drops into both eyes 3 (three) times daily.      . vitamin B-12 (CYANOCOBALAMIN) 1000 MCG tablet Take 1,000 mcg by mouth every morning.      Marland Kitchen erythromycin ophthalmic ointment Apply to left eye tid x 7d  3.5 g  0   No facility-administered medications prior to visit.    PE: Blood pressure 171/73, pulse 65, temperature 97.8 F (36.6 C), temperature source Temporal, resp. rate 16, SpO2 99.00%. Gen: sitting in WC, slumping over to the right some, head flexed forward some as per his usual.  He will make eye contact and respond to yes/no questions.  He does not initiate talking.   Eyes: injected but no drainage or swelling.  No icterus. Face symmetric, EOMI. CV: RRR, distant S1 and S2.  No audible m/r/g. Chest  is clear, no wheezing or rales. Normal symmetric air entry throughout both lung fields.   Nonlabored resps. Left hand dorsal surface over metacarpals and proximal digits there is some swelling but no warmth or erythema.  Decreased ROM of left wrist.  Elbows w/out swelling.  Knees with bony hypertrophy but no erythema or warmth or significant effusion. Ext: no edema    Chemistry      Component Value Date/Time   NA 140 07/03/2013 1052   K 4.1 07/03/2013 1052   CL 107 07/03/2013 1052   CO2 22 07/03/2013 1052   BUN 27* 07/03/2013 1052   CREATININE 1.2 07/03/2013 1052      Component Value Date/Time   CALCIUM 9.7 07/03/2013 1052   ALKPHOS 80 07/03/2013 1052   AST 20 07/03/2013 1052   ALT 16 07/03/2013 1052   BILITOT 0.8 07/03/2013 1052      IMPRESSION AND PLAN:  Swelling of joint,  hand, left Will try adding diclofenac 75mg  bid as long as BMET today is stable. No more systemic steroids--caused HTN and hyperglycemia.  Dementia with behavioral disturbance Having some hallucinations, occ behav disturbance--progressing normally. Daughter wants to keep aricept 5mg  on board because she recalls him being worse when this med was stopped in the past.  HTN (hypertension) Improved.  Will have much more lenient BP goal in him. Continue lisinopril 10mg  qd, recheck BMET here today.  Failure to thrive Progressive dementia. Doing ok with ensure. Hospice is involved.  Chronic renal insufficiency, stage III (moderate) Recheck BMET today.  Acute bronchitis Recent illness/cough seems to be resolving nicely.   An After Visit Summary was printed and given to the patient.  FOLLOW UP: 22mo

## 2013-08-10 NOTE — Assessment & Plan Note (Signed)
Having some hallucinations, occ behav disturbance--progressing normally. Daughter wants to keep aricept 5mg  on board because she recalls him being worse when this med was stopped in the past.

## 2013-08-10 NOTE — Assessment & Plan Note (Signed)
Progressive dementia. Doing ok with ensure. Hospice is involved.

## 2013-08-10 NOTE — Assessment & Plan Note (Signed)
Recheck BMET today 

## 2013-08-10 NOTE — Assessment & Plan Note (Signed)
Recent illness/cough seems to be resolving nicely.

## 2013-08-10 NOTE — Assessment & Plan Note (Signed)
Will try adding diclofenac 75mg  bid as long as BMET today is stable. No more systemic steroids--caused HTN and hyperglycemia.

## 2013-08-13 ENCOUNTER — Telehealth: Payer: Self-pay | Admitting: Family Medicine

## 2013-08-13 ENCOUNTER — Other Ambulatory Visit: Payer: Self-pay | Admitting: Family Medicine

## 2013-08-13 NOTE — Telephone Encounter (Signed)
Relevant patient education assigned to patient using Emmi. ° °

## 2013-08-14 ENCOUNTER — Other Ambulatory Visit: Payer: Self-pay | Admitting: *Deleted

## 2013-08-14 MED ORDER — FOLIC ACID 1 MG PO TABS: 1.0000 mg | ORAL_TABLET | Freq: Every morning | ORAL | Status: DC

## 2013-08-14 MED ORDER — ACETAMINOPHEN-CODEINE #3 300-30 MG PO TABS: 1.0000 | ORAL_TABLET | Freq: Three times a day (TID) | ORAL | Status: DC | PRN

## 2013-08-14 MED ORDER — ACETAMINOPHEN 500 MG PO TABS: 1000.0000 mg | ORAL_TABLET | Freq: Three times a day (TID) | ORAL | Status: DC

## 2013-08-14 MED ORDER — VITAMIN B-12 1000 MCG PO TABS: 1000.0000 ug | ORAL_TABLET | Freq: Every morning | ORAL | Status: DC

## 2013-08-14 NOTE — Telephone Encounter (Signed)
Rx faxed

## 2013-08-29 ENCOUNTER — Other Ambulatory Visit: Payer: Self-pay | Admitting: Family Medicine

## 2013-08-29 MED ORDER — LISINOPRIL 10 MG PO TABS: 10.0000 mg | ORAL_TABLET | Freq: Every day | ORAL | Status: DC

## 2013-09-03 ENCOUNTER — Telehealth: Payer: Self-pay | Admitting: Family Medicine

## 2013-09-03 MED ORDER — DONEPEZIL HCL 5 MG PO TABS: 5.0000 mg | ORAL_TABLET | Freq: Every day | ORAL | Status: DC

## 2013-09-03 NOTE — Telephone Encounter (Signed)
Patient's daughter requesting refill of donazepil.  She's aware you wanted to D/C it but she says he is having such a good week that she just doesn't want to jeopardize it.  Please advise refill.

## 2013-09-03 NOTE — Telephone Encounter (Signed)
OK to RF as previously prescribed, with 5 RF's.-thx

## 2013-09-03 NOTE — Telephone Encounter (Signed)
Spoke to patient's daughter and she gave me fax number [phone].  Rx faxed.

## 2013-09-05 ENCOUNTER — Telehealth: Payer: Self-pay | Admitting: Family Medicine

## 2013-09-05 NOTE — Telephone Encounter (Signed)
Patient needs a work order to allow him to take mucinex 12 hour OTC for his cough.  Please advise

## 2013-09-05 NOTE — Telephone Encounter (Signed)
Rx written.

## 2013-09-06 NOTE — Telephone Encounter (Signed)
Rx faxed

## 2013-09-10 ENCOUNTER — Encounter: Payer: Self-pay | Admitting: Family Medicine

## 2013-09-10 ENCOUNTER — Ambulatory Visit (INDEPENDENT_AMBULATORY_CARE_PROVIDER_SITE_OTHER): Payer: Medicare Other | Admitting: Family Medicine

## 2013-09-10 VITALS — BP 124/79 | HR 76 | Temp 98.8°F | Resp 16

## 2013-09-10 DIAGNOSIS — F03918 Unspecified dementia, unspecified severity, with other behavioral disturbance: Secondary | ICD-10-CM

## 2013-09-10 DIAGNOSIS — M19039 Primary osteoarthritis, unspecified wrist: Secondary | ICD-10-CM

## 2013-09-10 DIAGNOSIS — J209 Acute bronchitis, unspecified: Secondary | ICD-10-CM

## 2013-09-10 DIAGNOSIS — F0391 Unspecified dementia with behavioral disturbance: Secondary | ICD-10-CM

## 2013-09-10 DIAGNOSIS — J310 Chronic rhinitis: Secondary | ICD-10-CM

## 2013-09-10 DIAGNOSIS — M19032 Primary osteoarthritis, left wrist: Secondary | ICD-10-CM

## 2013-09-10 MED ORDER — IPRATROPIUM BROMIDE 0.03 % NA SOLN: 1.0000 | Freq: Three times a day (TID) | NASAL | Status: DC

## 2013-09-10 MED ORDER — AZITHROMYCIN 250 MG PO TABS: ORAL_TABLET | ORAL | Status: DC

## 2013-09-10 MED ORDER — PREDNISONE 10 MG PO TABS
10.0000 mg | ORAL_TABLET | Freq: Every day | ORAL | Status: DC
Start: 2013-09-10 — End: 2013-10-09

## 2013-09-10 NOTE — Progress Notes (Signed)
OFFICE NOTE  09/10/2013  CC:  Chief Complaint  Patient presents with  . Follow-up    1 month     HPI: Patient is a 80 y.o. Caucasian male who is here with his daughter for 1 mo f/u dementia, wrist/hand arthritis, HTN. Daughter voices some concern that he is not being given his aricept and mucinex at his ALF. Hosp[facility] visits [facility] ? Every week?Marland Kitchen.  They help with his depends, ensure, and bathing.   Still with cough, "bad sometimes and then he may go all day w/out coughing".  No fever, wheezing, or SOB. Daughter says he is acting tired, seems ill to her. No n/v.  Left wrist has been better lately, no swelling or redness.  Most recent steroids were 06/28/2013.   Pertinent PMH:  Past medical, surgical, social, and family history reviewed and no changes are noted since last office visit. Currently living in ALF and is under hospice care. MEDS:  Outpatient Prescriptions Prior to Visit  Medication Sig Dispense Refill  . acetaminophen (TYLENOL) 500 MG tablet Take 2 tablets (1,000 mg total) by mouth 3 (three) times daily.  90 tablet  5  . acetaminophen-codeine (TYLENOL #3) 300-30 MG per tablet Take 1-2 tablets by mouth every 8 (eight) hours as needed for moderate pain.  90 tablet  5  . donepezil (ARICEPT) 5 MG tablet Take 1 tablet (5 mg total) by mouth at bedtime.  90 tablet  3  . folic acid (FOLVITE) 1 MG tablet Take 1 tablet (1 mg total) by mouth every morning.  30 tablet  5  . ipratropium (ATROVENT) 0.03 % nasal spray Place 1 spray into both nostrils 3 (three) times daily.  30 mL  6  . lisinopril (PRINIVIL,ZESTRIL) 10 MG tablet Take 1 tablet (10 mg total) by mouth daily.  30 tablet  3  . Polyethyl Glycol-Propyl Glycol (SYSTANE OP) Place 2 drops into both eyes 3 (three) times daily.      . vitamin B-12 (CYANOCOBALAMIN) 1000 MCG tablet Take 1 tablet (1,000 mcg total) by mouth every morning.  30 tablet  5   No facility-administered medications prior to visit.    PE: Blood  pressure 124/79, pulse 76, temperature 98.8 F (37.1 C), temperature source Temporal, resp. rate 16, SpO2 95.00%. Gen: sitting in wheelchair partially slumped over to the right.  Lifts head in response to questions and he will make eye contact and make attempt at answering simple yes/no questions.  NAD. CV: RRR, no m/r/g.   LUNGS: CTA bilat, nonlabored resps, good aeration in all lung fields. Wrists: no deformity, swelling, erythema.  Mild warmth to left wrist and mild pain in wrist with ROM.  Mild TTP in left wrist.  Metacarpals and fingers appear normal, nontender, rom intact.  IMPRESSION AND PLAN: 1) Prolonged cough/bronchitis: cont mucinex DM, add zithromax trial x 5d, also prednisone 10mg  qd x 10d (hx of HTN on 40mg  qd dosing). 2) Left wrist arthritis: pretty calm currently, hopefully prednisone for bronchitis will also help this. Daughter will ask his orthopedist if steroid injection into wrist would be possible/beneficial.  I printed a copy of his CT wrist from Nov 2014 for daughter to take to ortho. 3) Dementia; stable.  Continue aricept 5mg  qd.  An After Visit Summary was printed and given to the patient.  An After Visit Summary was printed and given to the patient.   FOLLOW UP: 1 mo

## 2013-09-10 NOTE — Progress Notes (Signed)
Pre visit review using our clinic review tool, if applicable. No additional management support is needed unless otherwise documented below in the visit note. 

## 2013-10-09 ENCOUNTER — Ambulatory Visit (INDEPENDENT_AMBULATORY_CARE_PROVIDER_SITE_OTHER): Payer: Medicare Other | Admitting: Family Medicine

## 2013-10-09 ENCOUNTER — Encounter: Payer: Self-pay | Admitting: Family Medicine

## 2013-10-09 VITALS — BP 173/94 | HR 68 | Temp 98.0°F | Resp 16

## 2013-10-09 DIAGNOSIS — R251 Tremor, unspecified: Secondary | ICD-10-CM

## 2013-10-09 DIAGNOSIS — F0390 Unspecified dementia without behavioral disturbance: Secondary | ICD-10-CM

## 2013-10-09 DIAGNOSIS — R259 Unspecified abnormal involuntary movements: Secondary | ICD-10-CM

## 2013-10-09 NOTE — Progress Notes (Signed)
Pre visit review using our clinic review tool, if applicable. No additional management support is needed unless otherwise documented below in the visit note. 

## 2013-10-09 NOTE — Progress Notes (Signed)
OFFICE NOTE  10/09/2013  CC:  Chief Complaint  Patient presents with  . Follow-up     HPI: Patient is a 80 y.o. Caucasian male who is here for 1 mo f/u chronic medical issues/debilitation, dementia. Cough minimal, no fevers or SOB.  Finished steroids and abx. Definitely acting like left arm/wrist bothering him: stiff, doesn't use arm much---long hx of arthritis in shoulder/elbow/wrist. Has not seen ortho in a while, daughter plans on getting this set up soon.  BP avg 120s/70s as per NH report on phone today.  He has never been seen by a neurologist for dementia or tremor. I cannot recall him walking--has been in wheelchair since being in [employer]. Daughter does recall him having "the old man walk"--question of shuffling gait. Seems to have "mask-like facies".  Pertinent PMH:  Past medical, surgical, social, and family history reviewed and no changes are noted since last office visit.  MEDS:  Outpatient Prescriptions Prior to Visit  Medication Sig Dispense Refill  . acetaminophen (TYLENOL) 500 MG tablet Take 2 tablets (1,000 mg total) by mouth 3 (three) times daily.  90 tablet  5  . acetaminophen-codeine (TYLENOL #3) 300-30 MG per tablet Take 1-2 tablets by mouth every 8 (eight) hours as needed for moderate pain.  90 tablet  5  . donepezil (ARICEPT) 5 MG tablet Take 1 tablet (5 mg total) by mouth at bedtime.  90 tablet  3  . folic acid (FOLVITE) 1 MG tablet Take 1 tablet (1 mg total) by mouth every morning.  30 tablet  5  . ipratropium (ATROVENT) 0.03 % nasal spray Place 1 spray into both nostrils 3 (three) times daily.  30 mL  6  . lisinopril (PRINIVIL,ZESTRIL) 10 MG tablet Take 1 tablet (10 mg total) by mouth daily.  30 tablet  3  . Polyethyl Glycol-Propyl Glycol (SYSTANE OP) Place 2 drops into both eyes 3 (three) times daily.      . vitamin B-12 (CYANOCOBALAMIN) 1000 MCG tablet Take 1 tablet (1,000 mcg total) by mouth every morning.  30 tablet  5  . azithromycin (ZITHROMAX)  250 MG tablet 2 tabs po qd x 1d, then 1 tab po qd x 4d  6 each  0  . predniSONE (DELTASONE) 10 MG tablet Take 1 tablet (10 mg total) by mouth daily with breakfast.  10 tablet  0   No facility-administered medications prior to visit.    PE: Blood pressure 173/94, pulse 68, temperature 98 F (36.7 C), temperature source Temporal, resp. rate 16, SpO2 97.00%. Gen: alert, follows simple commands but does not talk to me today. He smiles when greeted. Stooped over in wheelchair, leaning to the right. Left shoulder, elbow, and wrist all stiff, with the sound of bone-bone popping with shoulder and elbow ROM.  No elbow swelling, erythema, or warmth. Left wrist and dorsal surface of hand with mild swelling, warmth, and tenderness.  He can actively flex and extend left wrist about 5 deg. Question of mild cogwheeling rigidity of both wrists, both elbows.  He is tremulous in upper extremities when holding hands up but just minimally tremulous when resting hands on lap. CV: RRR, no m/r/g.   LUNGS: CTA bilat, nonlabored resps, good aeration in all lung fields. EXT: 3+ pitting edema in both LE's with no skin abnormalities.  IMPRESSION AND PLAN:  1) HTN: The current medical regimen is effective;  continue present plan and medications.  2) Dementia: daughter is sure that he responds some to aricept.  However, I  will ask [place] neurologist/movement disorder specialist to evaluate him for possible parkinsonism since he seems to have tremor, cogwheeling rigidity, mask-like facies, hx of shuffling gait per daughter.  3) Cough: recent steroids/abx helped.  No further treatment at this time.  4) Diffuse osteoarthritis, left wrist particularly.  Daughter to arrange f/u with his orthopedist.   An After Visit Summary was printed and given to the patient.  FOLLOW UP: 6 wks

## 2013-10-16 ENCOUNTER — Encounter (HOSPITAL_COMMUNITY): Payer: Self-pay | Admitting: Emergency Medicine

## 2013-10-16 ENCOUNTER — Emergency Department (HOSPITAL_COMMUNITY)
Admission: EM | Admit: 2013-10-16 | Discharge: 2013-10-17 | Disposition: A | Discharge status: Home / Self Care | Attending: Emergency Medicine | Admitting: Emergency Medicine

## 2013-10-16 ENCOUNTER — Emergency Department (HOSPITAL_COMMUNITY)

## 2013-10-16 DIAGNOSIS — N183 Chronic kidney disease, stage 3 unspecified: Secondary | ICD-10-CM | POA: Insufficient documentation

## 2013-10-16 DIAGNOSIS — M19039 Primary osteoarthritis, unspecified wrist: Secondary | ICD-10-CM | POA: Insufficient documentation

## 2013-10-16 DIAGNOSIS — Z8551 Personal history of malignant neoplasm of bladder: Secondary | ICD-10-CM | POA: Insufficient documentation

## 2013-10-16 DIAGNOSIS — F0390 Unspecified dementia without behavioral disturbance: Secondary | ICD-10-CM | POA: Insufficient documentation

## 2013-10-16 DIAGNOSIS — Z87448 Personal history of other diseases of urinary system: Secondary | ICD-10-CM | POA: Insufficient documentation

## 2013-10-16 DIAGNOSIS — IMO0002 Reserved for concepts with insufficient information to code with codable children: Secondary | ICD-10-CM | POA: Insufficient documentation

## 2013-10-16 DIAGNOSIS — M1710 Unilateral primary osteoarthritis, unspecified knee: Secondary | ICD-10-CM | POA: Insufficient documentation

## 2013-10-16 DIAGNOSIS — Z9089 Acquired absence of other organs: Secondary | ICD-10-CM | POA: Insufficient documentation

## 2013-10-16 DIAGNOSIS — I129 Hypertensive chronic kidney disease with stage 1 through stage 4 chronic kidney disease, or unspecified chronic kidney disease: Secondary | ICD-10-CM | POA: Insufficient documentation

## 2013-10-16 DIAGNOSIS — Z9889 Other specified postprocedural states: Secondary | ICD-10-CM | POA: Insufficient documentation

## 2013-10-16 DIAGNOSIS — R197 Diarrhea, unspecified: Secondary | ICD-10-CM | POA: Insufficient documentation

## 2013-10-16 DIAGNOSIS — E860 Dehydration: Secondary | ICD-10-CM | POA: Insufficient documentation

## 2013-10-16 DIAGNOSIS — Z79899 Other long term (current) drug therapy: Secondary | ICD-10-CM | POA: Insufficient documentation

## 2013-10-16 DIAGNOSIS — R1110 Vomiting, unspecified: Secondary | ICD-10-CM | POA: Insufficient documentation

## 2013-10-16 LAB — CBC WITH DIFFERENTIAL/PLATELET
Basophils Absolute: 0.1 10*3/uL (ref 0.0–0.1)
Basophils Relative: 1 % (ref 0–1)
Eosinophils Absolute: 0.2 10*3/uL (ref 0.0–0.7)
Eosinophils Relative: 4 % (ref 0–5)
HCT: 36.2 % — ABNORMAL LOW (ref 39.0–52.0)
Hemoglobin: 12 g/dL — ABNORMAL LOW (ref 13.0–17.0)
LYMPHS PCT: 20 % (ref 12–46)
Lymphs Abs: 1.1 10*3/uL (ref 0.7–4.0)
MCH: 33.2 pg (ref 26.0–34.0)
MCHC: 33.1 g/dL (ref 30.0–36.0)
MCV: 100.3 fL — ABNORMAL HIGH (ref 78.0–100.0)
Monocytes Absolute: 0.7 10*3/uL (ref 0.1–1.0)
Monocytes Relative: 13 % — ABNORMAL HIGH (ref 3–12)
NEUTROS ABS: 3.4 10*3/uL (ref 1.7–7.7)
Neutrophils Relative %: 62 % (ref 43–77)
PLATELETS: 180 10*3/uL (ref 150–400)
RBC: 3.61 MIL/uL — AB (ref 4.22–5.81)
RDW: 13.7 % (ref 11.5–15.5)
WBC: 5.5 10*3/uL (ref 4.0–10.5)

## 2013-10-16 LAB — LACTIC ACID, PLASMA: LACTIC ACID, VENOUS: 0.8 mmol/L (ref 0.5–2.2)

## 2013-10-16 LAB — COMPREHENSIVE METABOLIC PANEL
ALT: 11 U/L (ref 0–53)
AST: 16 U/L (ref 0–37)
Albumin: 3.1 g/dL — ABNORMAL LOW (ref 3.5–5.2)
Alkaline Phosphatase: 54 U/L (ref 39–117)
BILIRUBIN TOTAL: 0.4 mg/dL (ref 0.3–1.2)
BUN: 35 mg/dL — ABNORMAL HIGH (ref 6–23)
CALCIUM: 9.2 mg/dL (ref 8.4–10.5)
CHLORIDE: 113 meq/L — AB (ref 96–112)
CO2: 18 meq/L — AB (ref 19–32)
Creatinine, Ser: 1.53 mg/dL — ABNORMAL HIGH (ref 0.50–1.35)
GFR calc Af Amer: 45 mL/min — ABNORMAL LOW (ref >90)
GFR, EST NON AFRICAN AMERICAN: 39 mL/min — AB (ref >90)
Glucose, Bld: 86 mg/dL (ref 70–99)
Potassium: 4 mEq/L (ref 3.7–5.3)
SODIUM: 144 meq/L (ref 137–147)
Total Protein: 5.8 g/dL — ABNORMAL LOW (ref 6.0–8.3)

## 2013-10-16 LAB — LIPASE, BLOOD: Lipase: 18 U/L (ref 11–59)

## 2013-10-16 MED ORDER — SODIUM CHLORIDE 0.9 % IV BOLUS (SEPSIS)
1000.0000 mL | Freq: Once | INTRAVENOUS | Status: AC
  Administered 2013-10-16: 1000 mL via INTRAVENOUS

## 2013-10-16 NOTE — ED Notes (Signed)
Bed: [id number] Expected date: 10/16/13 Expected time: 9:01 PM Means of arrival: Ambulance Comments: N,V,D

## 2013-10-16 NOTE — ED Provider Notes (Signed)
CSN: [id number]     Arrival date & time 10/16/13  2116 History   First MD Initiated Contact with Patient 10/16/13 2302     Chief Complaint  Patient presents with  . Emesis  . Diarrhea     (Consider location/radiation/quality/duration/timing/severity/associated sxs/prior Treatment) HPI  This is an 80 year old male with history of dementia, urinary incontinence, hypertension, renal insufficiency who presents with diarrhea and vomiting. Per the patient's daughter, [facility] and there has been a GI virus going around.  Daughter reports decreased by mouth intake and nonbloody diarrhea. She is unsure whether he's had any vomiting although vomiting was reported by EMS. She states that the patient is at his baseline. At baseline he is alert and oriented x2. She states when he doesn't fill well the only sleeps. He has been afebrile.  The patient is noncontributory to history taking. Level V caveat for dementia.  Past Medical History  Diagnosis Date  . History of bladder cancer   . Dementia     With hallucinations/paranoia.  July 2013 CT brain: atrophy + small vessel ischemic demyelination  . Osteoarthritis of both knees   . Urinary incontinence   . History of non anemic vitamin B12 deficiency 08/2011 (level was 171)    Vit B12 level normal (466) 11/2011 at previous PCP.  Marland Kitchen. History of hypothyroidism     currently off meds  . History of hypertension     Normotensive off meds as of 11/2011  . Chronic renal insufficiency, stage III (moderate)     Borderline stage II/III (Est CrCl about 60).  . H/O phimosis 2012/2013    Urology recommended circumcision.  Cardiology cleared him for surgery 08/09/2012.  . 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 Hernia repair    . Shoulder surgery      Right  . Cholecystectomy  1980s  . Venous duplex doppler of left upper  extremity  05/2013    NEG for DVT or superficial venous thrombosis.   Family History  Problem Relation Age of Onset  . Congestive Heart Failure Mother    History  Substance Use Topics  . Smoking status: Never Smoker   . Smokeless tobacco: Never Used  . Alcohol Use: No    Review of Systems  Unable to perform ROS: Dementia      Allergies  Keflex and Tramadol  Home Medications   Current Outpatient Rx  Name  Route  Sig  Dispense  Refill  . acetaminophen (TYLENOL) 500 MG tablet   Oral   Take 2 tablets (1,000 mg total) by mouth 3 (three) times daily.   90 tablet   5   . acetaminophen-codeine (TYLENOL #3) 300-30 MG per tablet   Oral   Take 1-2 tablets by mouth every 8 (eight) hours as needed for moderate pain.   90 tablet   5   . chlorpheniramine-HYDROcodone (TUSSIONEX) 10-8 MG/5ML LQCR   Oral   Take 5 mLs by mouth every 12 (twelve) hours as needed for cough.         . donepezil (ARICEPT) 5 MG tablet   Oral   Take 1 tablet (5 mg total) by mouth at bedtime.   90 tablet   3     Please dispense in blister packaging.   . feeding supplement (ENSURE IMMUNE HEALTH) LIQD   Oral   Take 237 mLs by mouth  2 (two) times daily. Vanilla flavor         . folic acid (FOLVITE) 1 MG tablet   Oral   Take 1 tablet (1 mg total) by mouth every morning.   30 tablet   5   . guaiFENesin (MUCINEX) 600 MG 12 hr tablet   Oral   Take 600 mg by mouth every 12 (twelve) hours as needed for cough.         Marland Kitchen ipratropium (ATROVENT) 0.03 % nasal spray   Each Nare   Place 1 spray into both nostrils 3 (three) times daily.   30 mL   6   . lidocaine (LIDODERM) 5 %   Transdermal   Place 1 patch onto the skin daily as needed (To lower back and left knee). Remove & Discard patch within 12 hours or as directed by MD         . lisinopril (PRINIVIL,ZESTRIL) 10 MG tablet   Oral   Take 1 tablet (10 mg total) by mouth daily.   30 tablet   3   . neomycin-bacitracin-polymyxin  (NEOSPORIN) OINT   Topical   Apply 1 application topically daily. To skin tear on right arm until healed         . Polyethyl Glycol-Propyl Glycol (SYSTANE OP)   Both Eyes   Place 2 drops into both eyes 3 (three) times daily.         . vitamin B-12 (CYANOCOBALAMIN) 1000 MCG tablet   Oral   Take 1 tablet (1,000 mcg total) by mouth every morning.   30 tablet   5    BP 174/83  Pulse 79  Temp(Src) 98.7 F (37.1 C) (Rectal)  Resp 15  SpO2 94% Physical Exam  Nursing note and vitals reviewed. Constitutional: He is oriented to person, place, and time. No distress.  Elderly, thin  HENT:  Head: Normocephalic and atraumatic.  Mucous membranes dry  Eyes: Pupils are equal, round, and reactive to light.  Neck: Neck supple. No JVD present.  Cardiovascular: Normal rate, regular rhythm and normal heart sounds.   No murmur heard. Pulmonary/Chest: Effort normal and breath sounds normal. No respiratory distress. He has no wheezes.  Abdominal: Soft. Bowel sounds are normal. There is no tenderness. There is no rebound and no guarding.  Musculoskeletal: He exhibits no edema.  Lymphadenopathy:    He has no cervical adenopathy.  Neurological: He is alert and oriented to person, place, and time.  Skin: Skin is warm and dry.  Psychiatric: He has a normal mood and affect.    ED Course  Procedures (including critical care time) Labs Review Labs Reviewed  CBC WITH DIFFERENTIAL - Abnormal; Notable for the following:    RBC 3.61 (*)    Hemoglobin 12.0 (*)    HCT 36.2 (*)    MCV 100.3 (*)    Monocytes Relative 13 (*)    All other components within normal limits  COMPREHENSIVE METABOLIC PANEL - Abnormal; Notable for the following:    Chloride 113 (*)    CO2 18 (*)    BUN 35 (*)    Creatinine, Ser 1.53 (*)    Total Protein 5.8 (*)    Albumin 3.1 (*)    GFR calc non Af Amer 39 (*)    GFR calc Af Amer 45 (*)    All other components within normal limits  BASIC METABOLIC PANEL - Abnormal;  Notable for the following:    Chloride 116 (*)    CO2 17 (*)  BUN 33 (*)    Creatinine, Ser 1.39 (*)    GFR calc non Af Amer 43 (*)    GFR calc Af Amer 50 (*)    All other components within normal limits  LIPASE, BLOOD  URINALYSIS, ROUTINE W REFLEX MICROSCOPIC  LACTIC ACID, PLASMA   Imaging Review Dg Abd 1 View  10/16/2013   CLINICAL DATA:  Vomiting and diarrhea for 24 hr  EXAM: ABDOMEN - 1 VIEW  COMPARISON:  None.  FINDINGS: No evidence of bowel obstruction. Small locules of gas in the right abdomen could be within nondilated small bowel or represent gas within colonic diverticula, which is noted in the left flank. Suspect mild to moderate bladder distention. Cholecystectomy changes.  IMPRESSION: Nonobstructive bowel gas pattern.  Colonic diverticulosis.   Electronically Signed   By: Tiburcio Pea M.D.   On: 10/16/2013 23:59     EKG Interpretation None      MDM   Final diagnoses:  Diarrhea  Dehydration   Patient presents with diarrhea and reports of AMS.  Daughter reports that he is at his baseline and oriented x2.  Appears dehydrated on exam.  No abdominal pain.  Labwork notable for mild elevation in creatinine above baseline but otherwise unremarkable.  Patient is afebrile and without evidence of leukoyctosis.  Patient given fluids given mild AKI and repeat BMP shows some improvement in creatinine.  GIven that the patient is at his baseline, feel patient can be safely discharged home. NO vomiting or diarrhea noted in the ER.  Discussed with daughter.  REpeat BMP recommended in 2 days.  After history, exam, and medical workup I feel the patient has been appropriately medically screened and is safe for discharge home. Pertinent diagnoses were discussed with the patient. Patient was given return precautions.     Shon Baton, MD 10/17/13 [phone]

## 2013-10-16 NOTE — Progress Notes (Signed)
CSW met with patient at bedside. Patient present with a soft spoken manor like a whisper. Patient confirms he will return to [facility] once he is medically cleared.  Patient reports he is not certain but he believes his family is aware of this ED admission.  CSW called the facility and spoke with [facility] and she confirms the patient's family is aware of his where about plus he will return once medically cleared.      Chesley Noon, MSW, Time, 10/16/2013 Evening Clinical Social Worker [phone]

## 2013-10-16 NOTE — ED Notes (Signed)
[facility] been a stomach virus circulating among residents. Pt has had N/V/D x 2 days. Staff noticed pt was more lethargic x 1 hour ago. 20g LAC. Hx of dementia. Denies pain.

## 2013-10-17 LAB — BASIC METABOLIC PANEL
BUN: 33 mg/dL — ABNORMAL HIGH (ref 6–23)
CHLORIDE: 116 meq/L — AB (ref 96–112)
CO2: 17 meq/L — AB (ref 19–32)
Calcium: 8.7 mg/dL (ref 8.4–10.5)
Creatinine, Ser: 1.39 mg/dL — ABNORMAL HIGH (ref 0.50–1.35)
GFR calc Af Amer: 50 mL/min — ABNORMAL LOW (ref >90)
GFR calc non Af Amer: 43 mL/min — ABNORMAL LOW (ref >90)
Glucose, Bld: 77 mg/dL (ref 70–99)
Potassium: 3.8 mEq/L (ref 3.7–5.3)
Sodium: 146 mEq/L (ref 137–147)

## 2013-10-17 LAB — URINALYSIS, ROUTINE W REFLEX MICROSCOPIC
Bilirubin Urine: NEGATIVE
Glucose, UA: NEGATIVE mg/dL
Hgb urine dipstick: NEGATIVE
Ketones, ur: NEGATIVE mg/dL
Leukocytes, UA: NEGATIVE
Nitrite: NEGATIVE
Protein, ur: NEGATIVE mg/dL
Specific Gravity, Urine: 1.02 (ref 1.005–1.030)
Urobilinogen, UA: 0.2 mg/dL (ref 0.0–1.0)
pH: 5.5 (ref 5.0–8.0)

## 2013-10-17 NOTE — Discharge Instructions (Signed)
Dehydration, Adult Dehydration is when you lose more fluids from the body than you take in. Vital organs like the kidneys, brain, and heart cannot function without a proper amount of fluids and salt. Any loss of fluids from the body can cause dehydration.   Patient needs repeat chemistry in 2 days to evaluate kidney function. CAUSES   Vomiting.  Diarrhea.  Excessive sweating.  Excessive urine output.  Fever. SYMPTOMS  Mild dehydration  Thirst.  Dry lips.  Slightly dry mouth. Moderate dehydration  Very dry mouth.  Sunken eyes.  Skin does not bounce back quickly when lightly pinched and released.  Dark urine and decreased urine production.  Decreased tear production.  Headache. Severe dehydration  Very dry mouth.  Extreme thirst.  Rapid, weak pulse (more than 100 beats per minute at rest).  Cold hands and feet.  Not able to sweat in spite of heat and temperature.  Rapid breathing.  Blue lips.  Confusion and lethargy.  Difficulty being awakened.  Minimal urine production.  No tears. DIAGNOSIS  Your caregiver will diagnose dehydration based on your symptoms and your exam. Blood and urine tests will help confirm the diagnosis. The diagnostic evaluation should also identify the cause of dehydration. TREATMENT  Treatment of mild or moderate dehydration can often be done at home by increasing the amount of fluids that you drink. It is best to drink small amounts of fluid more often. Drinking too much at one time can make vomiting worse. Refer to the home care instructions below. Severe dehydration needs to be treated at the hospital where you will probably be given intravenous (IV) fluids that contain water and electrolytes. HOME CARE INSTRUCTIONS   Ask your caregiver about specific rehydration instructions.  Drink enough fluids to keep your urine clear or pale yellow.  Drink small amounts frequently if you have nausea and vomiting.  Eat as you  normally do.  Avoid:  Foods or drinks high in sugar.  Carbonated drinks.  Juice.  Extremely hot or cold fluids.  Drinks with caffeine.  Fatty, greasy foods.  Alcohol.  Tobacco.  Overeating.  Gelatin desserts.  Wash your hands well to avoid spreading bacteria and viruses.  Only take over-the-counter or prescription medicines for pain, discomfort, or fever as directed by your caregiver.  Ask your caregiver if you should continue all prescribed and over-the-counter medicines.  Keep all follow-up appointments with your caregiver. SEEK MEDICAL CARE IF:  You have abdominal pain and it increases or stays in one area (localizes).  You have a rash, stiff neck, or severe headache.  You are irritable, sleepy, or difficult to awaken.  You are weak, dizzy, or extremely thirsty. SEEK IMMEDIATE MEDICAL CARE IF:   You are unable to keep fluids down or you get worse despite treatment.  You have frequent episodes of vomiting or diarrhea.  You have blood or green matter (bile) in your vomit.  You have blood in your stool or your stool looks black and tarry.  You have not urinated in 6 to 8 hours, or you have only urinated a small amount of very dark urine.  You have a fever.  You faint. MAKE SURE YOU:   Understand these instructions.  Will watch your condition.  Will get help right away if you are not doing well or get worse. Document Released: 07/05/2005 Document Revised: 09/27/2011 Document Reviewed: 02/22/2011 [facility] Patient Information 2014 [employer] [place]. Diarrhea Diarrhea is frequent loose and watery bowel movements. It can cause you to feel  weak and dehydrated. Dehydration can cause you to become tired and thirsty, have a dry mouth, and have decreased urination that often is dark yellow. Diarrhea is a sign of another problem, most often an infection that will not last long. In most cases, diarrhea typically lasts 2 3 days. However, it can last longer if it is a  sign of something more serious. It is important to treat your diarrhea as directed by your caregive to lessen or prevent future episodes of diarrhea. CAUSES  Some common causes include:  Gastrointestinal infections caused by viruses, bacteria, or parasites.  Food poisoning or food allergies.  Certain medicines, such as antibiotics, chemotherapy, and laxatives.  Artificial sweeteners and fructose.  Digestive disorders. HOME CARE INSTRUCTIONS  Ensure adequate fluid intake (hydration): have 1 cup (8 oz) of fluid for each diarrhea episode. Avoid fluids that contain simple sugars or sports drinks, fruit juices, whole milk products, and sodas. Your urine should be clear or pale yellow if you are drinking enough fluids. Hydrate with an oral rehydration solution that you can purchase at pharmacies, retail stores, and online. You can prepare an oral rehydration solution at home by mixing the following ingredients together:    tsp table salt.   tsp baking soda.   tsp salt substitute containing potassium chloride.  1  tablespoons sugar.  1 L (34 oz) of water.  Certain foods and beverages may increase the speed at which food moves through the gastrointestinal (GI) tract. These foods and beverages should be avoided and include:  Caffeinated and alcoholic beverages.  High-fiber foods, such as raw fruits and vegetables, nuts, seeds, and whole grain breads and cereals.  Foods and beverages sweetened with sugar alcohols, such as xylitol, sorbitol, and mannitol.  Some foods may be well tolerated and may help thicken stool including:  Starchy foods, such as rice, toast, pasta, low-sugar cereal, oatmeal, grits, baked potatoes, crackers, and bagels.  Bananas.  Applesauce.  Add probiotic-rich foods to help increase healthy bacteria in the GI tract, such as yogurt and fermented milk products.  Wash your hands well after each diarrhea episode.  Only take over-the-counter or prescription  medicines as directed by your caregiver.  Take a warm bath to relieve any burning or pain from frequent diarrhea episodes. SEEK IMMEDIATE MEDICAL CARE IF:   You are unable to keep fluids down.  You have persistent vomiting.  You have blood in your stool, or your stools are black and tarry.  You do not urinate in 6 8 hours, or there is only a small amount of very dark urine.  You have abdominal pain that increases or localizes.  You have weakness, dizziness, confusion, or lightheadedness.  You have a severe headache.  Your diarrhea gets worse or does not get better.  You have a fever or persistent symptoms for more than 2 3 days.  You have a fever and your symptoms suddenly get worse. MAKE SURE YOU:   Understand these instructions.  Will watch your condition.  Will get help right away if you are not doing well or get worse. Document Released: 06/25/2002 Document Revised: 06/21/2012 Document Reviewed: 03/12/2012 [facility] Patient Information 2014 [place], [place].

## 2013-10-17 NOTE — ED Notes (Signed)
[facility] called for transport.  

## 2013-10-22 ENCOUNTER — Ambulatory Visit: Payer: Medicare Other | Admitting: Neurology

## 2013-11-09 ENCOUNTER — Telehealth: Payer: Self-pay | Admitting: Family Medicine

## 2013-11-09 NOTE — Telephone Encounter (Signed)
Patient is sleeping all of the time. He becomes combative if they try to wake him up to eat or do anything. When patient's daughter asked the nurses about his medication one of the nurses said she gives him a tylenol 3 with codeine in the morning & another nurse told her she gives him one in the evening.  When [place] checked the medication log they've only been recording one a day. Patient's daughter is requesting that Dr. Milinda CaveMcGowen write orders to stop giving patient the tylenol 3 with codeine but that it's okay to give regular tylenol. She said she didn't think the tylenol 3 is helping with his pain anyway. Advised that Dr. Milinda CaveMcGowen is out of the office today & that he will be able to address this on Monday.

## 2013-11-09 NOTE — Telephone Encounter (Signed)
Please advise 

## 2013-11-12 NOTE — Telephone Encounter (Signed)
OK. Rx written to be faxed to his [facility].

## 2013-11-13 NOTE — Telephone Encounter (Signed)
Left detailed message for pt letting her know orders have been faxed.

## 2013-11-16 ENCOUNTER — Ambulatory Visit (INDEPENDENT_AMBULATORY_CARE_PROVIDER_SITE_OTHER): Admitting: Neurology

## 2013-11-16 ENCOUNTER — Encounter: Payer: Self-pay | Admitting: Neurology

## 2013-11-16 VITALS — BP 132/80 | HR 80 | Resp 14

## 2013-11-16 DIAGNOSIS — G20 Parkinson's disease: Secondary | ICD-10-CM

## 2013-11-16 DIAGNOSIS — F0280 Dementia in other diseases classified elsewhere without behavioral disturbance: Secondary | ICD-10-CM

## 2013-11-16 MED ORDER — CARBIDOPA-LEVODOPA 25-100 MG PO TABS: 0.5000 | ORAL_TABLET | Freq: Three times a day (TID) | ORAL | Status: DC

## 2013-11-16 NOTE — Progress Notes (Signed)
Joshua Gardner was seen today in the movement disorders clinic for neurologic consultation at the request of MCGOWEN,PHILIP H, MD.  The consultation is for the evaluation of parkinsonism and memory loss.  Pt presents with daughter who supplements and supplies most of the the hx.  Pt has placed in assisted living since nov 2013, when his daughter would go over and find him forgetting to eat and with urinary and fecal incontinence.  His wife died in 2012 but it is believed that he had memory loss before that but that his wife "covered" for him.  However, the memory changes escalated after the death of his wife.  He was walking prior to the death of his wife, but once placed into ECF, he quit ambulating.  His daughter believes that [facility]   Specific Symptoms:  Tremor: yes (some in the L hand when he lifts it up and rarely in the R hand) Voice: hypophonic Sleep: sleeps a lot of the day and night  Vivid Dreams:  no  Acting out dreams:  no Wet Pillows: yes Postural symptoms:  [facility] Bradykinesia symptoms: difficulty with initiating movement, slowed thought processes and drooling while awake Loss of smell:  yes Loss of taste:  yes Urinary Incontinence:  yes Difficulty Swallowing:  yes (got choked "all his life" with liquids; eats normal diet) Handwriting, micrographia: yes Trouble with ADL's:  no  Trouble buttoning clothing: no Depression:  no Memory changes:  yes (on aricept since end of 2013) Hallucinations:  yes  visual distortions: yes N/V:  no Lightheaded: unknown - daughter answers most of these questions - pt doesn't participate  Syncope: no Diplopia:  unknown  w today.   PREVIOUS MEDICATIONS: none to date  ALLERGIES:   Allergies  Allergen Reactions  . Keflex [Cephalexin]     Doesn't remember reaction  . Tramadol Other (See Comments)    Delirium    CURRENT MEDICATIONS:  Current Outpatient Prescriptions on  File Prior to Visit  Medication Sig Dispense Refill  . acetaminophen (TYLENOL) 500 MG tablet Take 2 tablets (1,000 mg total) by mouth 3 (three) times daily.  90 tablet  5  . acetaminophen-codeine (TYLENOL #3) 300-30 MG per tablet Take 1-2 tablets by mouth every 8 (eight) hours as needed for moderate pain.  90 tablet  5  . chlorpheniramine-HYDROcodone (TUSSIONEX) 10-8 MG/5ML LQCR Take 5 mLs by mouth every 12 (twelve) hours as needed for cough.      . donepezil (ARICEPT) 5 MG tablet Take 1 tablet (5 mg total) by mouth at bedtime.  90 tablet  3  . feeding supplement (ENSURE IMMUNE HEALTH) LIQD Take 237 mLs by mouth 2 (two) times daily. Vanilla flavor      . folic acid (FOLVITE) 1 MG tablet Take 1 tablet (1 mg total) by mouth every morning.  30 tablet  5  . guaiFENesin (MUCINEX) 600 MG 12 hr tablet Take 600 mg by mouth every 12 (twelve) hours as needed for cough.      Marland Kitchen. ipratropium (ATROVENT) 0.03 % nasal spray Place 1 spray into both nostrils 3 (three) times daily.  30 mL  6  . lidocaine (LIDODERM) 5 % Place 1 patch onto the skin daily as needed (To lower back and left knee). Remove & Discard patch within 12 hours or as directed by MD      . lisinopril (PRINIVIL,ZESTRIL) 10 MG tablet Take 1 tablet (10 mg  total) by mouth daily.  30 tablet  3  . neomycin-bacitracin-polymyxin (NEOSPORIN) OINT Apply 1 application topically daily. To skin tear on right arm until healed      . Polyethyl Glycol-Propyl Glycol (SYSTANE OP) Place 2 drops into both eyes 3 (three) times daily.      . vitamin B-12 (CYANOCOBALAMIN) 1000 MCG tablet Take 1 tablet (1,000 mcg total) by mouth every morning.  30 tablet  5   No current facility-administered medications on file prior to visit.    PAST MEDICAL HISTORY:   Past Medical History  Diagnosis Date  . History of bladder cancer   . Dementia     With hallucinations/paranoia.  July 2013 CT brain: atrophy + small vessel ischemic demyelination  . Osteoarthritis of both knees   .  Urinary incontinence   . History of non anemic vitamin B12 deficiency 08/2011 (level was 171)    Vit B12 level normal (466) 11/2011 at previous PCP.  Marland Kitchen. History of hypothyroidism     currently off meds  . History of hypertension     Normotensive off meds as of 11/2011  . Chronic renal insufficiency, stage III (moderate)     Borderline stage II/III (Est CrCl about 60).  . H/O phimosis 2012/2013    Urology recommended circumcision.  Cardiology cleared him for surgery 08/09/2012.  . Osteoarthritis of left wrist 05/2013    with subtle erosive changes--chronic left hand/wrist swelling    PAST SURGICAL HISTORY:   Past Surgical History  Procedure Laterality Date  . Hernia repair    . Shoulder surgery      Right  . Cholecystectomy  1980s  . Venous duplex doppler of left upper extremity  05/2013    NEG for DVT or superficial venous thrombosis.    SOCIAL HISTORY:   History   Social History  . Marital Status: Widowed    Spouse Name: N/A    Number of Children: N/A  . Years of Education: N/A   Occupational History  . - dye lab    Social History Main Topics  . Smoking status: Never Smoker   . Smokeless tobacco: Never Used  . Alcohol Use: No  . Drug Use: No  . Sexual Activity: Not on file   Other Topics Concern  . Not on file   Social History Narrative   Widower as of 07/2010, had 4 children, two of which are living.   [facility]   Daughter Claudell KyleSandra Parkes is in charge of his care.   Essentially confined to wheelchair due to severe knee osteoarthritis.   No T/A/Ds.                FAMILY HISTORY:   Family Status  Relation Status Death Age  . Mother Deceased [id number]    CHF  . Father Deceased     78 y/o from gangrene  . Sister Alive   . Sister Alive   . Sister Alive   . Sister Deceased     CHF  . Brother Deceased     colon cancer  . Brother Deceased     brain cancer  . Brother Deceased     emphysema    ROS:  Pt  doesn't participate with large majority of ROS.  Does state that he has elbow pain on the L and c/o drooling.    PHYSICAL EXAMINATION:    VITALS:   Filed Vitals:   11/16/13 0807  BP: 132/80  Pulse:  80  Resp: 14   Pt sleeps during much of visit and is only intermittenly cooperative with commands.    GEN:  The patient appears stated age and is in NAD. HEENT:  Normocephalic, atraumatic.  The mucous membranes are dry. The superficial temporal arteries are without ropiness or tenderness. CV:  RRR Lungs:  CTAB Neck/HEME:  There are no carotid bruits bilaterally.  Neurological examination:  Orientation: A complete MMSE was done on 11/16/13 and the patient scored a 6/30.  He is not oriented to month/date/year/place/season or city.  He cannot copy a cube.  He does not do well on trail making tasks. Cranial nerves: There is good facial symmetry. Pupils are equal, small and minimally reactive. Fundoscopic exam is attempted, but the patient squeezes his eyes tightly shut and it is impossible to get a good look at the fundi.  He does not participate with extraocular muscle testing.  He does not take part in formal confrontational visual field testing, but does blink to visual menace .  The speech is dysarthric.  Soft palate does rises symmetrically.  Hearing does appear to be intact to conversational tone.  Sensation: Sensation is intact to light and pinprick throughout (facial, trunk, extremities).  He does not participate with more sensitive aspects of the sensory testing. Deep tendon reflexes: Deep tendon reflexes are 1/4 at the bilateral biceps, triceps, brachioradialis, patella and achilles.  Movement examination: Tone: There is  increased tone in the left upper extremity.  Tone in the right upper and bilateral lower extremities is normal.   Abnormal movements:  a tremor can be felt in the left upper extremity.   Coordination:  There appears to be decremation with rapid alternating movements on  the left, but the patient is minimally cooperative and somewhat apraxic with commands of coordination.   Gait and Station: The patient cannot ambulate and is wheelchair bound.  He does not transfer without significant assistance.      ASSESSMENT/PLAN:   1.  Parkinsonism.  I do suspect that this represents idiopathic Parkinson's disease.  I had a long discussion with the patient and his daughter.  I am somewhat leery about starting carbidopa/levodopa, as the benefits really are fairly small in a patient who has advanced dementia and is non-ambulatory.    This is primarily affecting his non dominant hand.  I talked to the patient and his daughter about the fact that if we try levodopa, it could potentially increase hallucinations.  Nonetheless, they would like to try.  I will very cautiously add 1/2 tablet of carbidopa/levodopa 25/100 tid before meals.  If it increases hallucinations, it will be d/c.  If it helps with SE, we could try and increase it cautiously.  Risks, benefits, side effects and alternative therapies were discussed.  The opportunity to ask questions was given and they were answered to the best of my ability.  The patients daughter expressed understanding and willingness to follow the outlined treatment protocols.  Pt education provided. 2.  Advanced dementia  -Not sure if the aricept is helping with a MoCA of only 6 but his daughter thinks that it does, so I did not change it.  -Pt in hospice currently 3.  Sialorrhea  -Could consider myobloc if worsens but don't think it is to that point.   4.  Return in about 3 months (around 02/16/2014).

## 2013-11-21 ENCOUNTER — Ambulatory Visit: Payer: Medicare Other | Admitting: Family Medicine

## 2013-11-30 ENCOUNTER — Other Ambulatory Visit: Payer: Self-pay | Admitting: Family Medicine

## 2013-11-30 NOTE — Telephone Encounter (Signed)
Please advise rf of systane.  I don't see where you actually Rx'd this medication before.

## 2013-11-30 NOTE — Telephone Encounter (Signed)
Please check to see if there are any other meds that are due a refill.  Patient's daughter also wanted to thank Dr. Milinda CaveMcGowen. Dr. Milinda CaveMcGowen had felt like patient had Parkinson's disease. Patient has been diagnosed with Parkinson & the medication has made the patient much more alert.

## 2013-12-03 MED ORDER — POLYETHYL GLYCOL-PROPYL GLYCOL 0.4-0.3 % OP SOLN: 2.0000 [drp] | Freq: Three times a day (TID) | OPHTHALMIC | Status: DC

## 2013-12-03 MED ORDER — LISINOPRIL 10 MG PO TABS: 10.0000 mg | ORAL_TABLET | Freq: Every day | ORAL | Status: DC

## 2013-12-03 MED ORDER — LISINOPRIL 10 MG PO TABS
10.0000 mg | ORAL_TABLET | Freq: Every day | ORAL | Status: DC
Start: ? — End: 2013-12-03

## 2013-12-03 NOTE — Addendum Note (Signed)
Addended by: Eulah PontALBRIGHT, LISA M on: 12/03/2013 09:33 AM   Modules accepted: Orders

## 2013-12-03 NOTE — Telephone Encounter (Signed)
OK to do rx for systane eye drops, 2 drops each eye tid, 1 large bottle, RF x 6.-thx

## 2013-12-20 ENCOUNTER — Telehealth: Payer: Self-pay | Admitting: Family Medicine

## 2013-12-20 NOTE — Telephone Encounter (Signed)
Yes it is fine to stay on the Aricept as ordered 5mg  once a day.

## 2013-12-20 NOTE — Telephone Encounter (Signed)
Pt;s daughter states he is still confused. She states he is asking when he is going home and asking about his work schedule. She states she know he is on medications for the Parkinson's but felt he should still be taking the Aricept also. Can he still take this with his other meds? Please advise.

## 2013-12-20 NOTE — Telephone Encounter (Signed)
OK to stay off of aricept.-thx

## 2013-12-20 NOTE — Telephone Encounter (Signed)
Pt's daughter aware.  She will address them not giving the pt aricept with nursing staff.

## 2013-12-20 NOTE — Telephone Encounter (Signed)
Pt's daughter called stating that pt's home hasn't been giving him the aricept.  Please advise if he should continue to take that.

## 2014-01-14 ENCOUNTER — Emergency Department (HOSPITAL_COMMUNITY)
Admission: EM | Admit: 2014-01-14 | Discharge: 2014-01-15 | Disposition: A | Discharge status: Home / Self Care | Payer: Medicare Other | Attending: Emergency Medicine | Admitting: Emergency Medicine

## 2014-01-14 ENCOUNTER — Encounter (HOSPITAL_COMMUNITY): Payer: Self-pay | Admitting: Emergency Medicine

## 2014-01-14 DIAGNOSIS — Z79899 Other long term (current) drug therapy: Secondary | ICD-10-CM | POA: Diagnosis not present

## 2014-01-14 DIAGNOSIS — Z8639 Personal history of other endocrine, nutritional and metabolic disease: Secondary | ICD-10-CM | POA: Insufficient documentation

## 2014-01-14 DIAGNOSIS — N183 Chronic kidney disease, stage 3 unspecified: Secondary | ICD-10-CM | POA: Diagnosis not present

## 2014-01-14 DIAGNOSIS — F0390 Unspecified dementia without behavioral disturbance: Secondary | ICD-10-CM | POA: Insufficient documentation

## 2014-01-14 DIAGNOSIS — Z862 Personal history of diseases of the blood and blood-forming organs and certain disorders involving the immune mechanism: Secondary | ICD-10-CM | POA: Diagnosis not present

## 2014-01-14 DIAGNOSIS — R230 Cyanosis: Secondary | ICD-10-CM | POA: Diagnosis present

## 2014-01-14 DIAGNOSIS — M19039 Primary osteoarthritis, unspecified wrist: Secondary | ICD-10-CM | POA: Insufficient documentation

## 2014-01-14 DIAGNOSIS — R609 Edema, unspecified: Secondary | ICD-10-CM | POA: Insufficient documentation

## 2014-01-14 DIAGNOSIS — I129 Hypertensive chronic kidney disease with stage 1 through stage 4 chronic kidney disease, or unspecified chronic kidney disease: Secondary | ICD-10-CM | POA: Insufficient documentation

## 2014-01-14 DIAGNOSIS — IMO0002 Reserved for concepts with insufficient information to code with codable children: Secondary | ICD-10-CM | POA: Diagnosis not present

## 2014-01-14 DIAGNOSIS — L97511 Non-pressure chronic ulcer of other part of right foot limited to breakdown of skin: Secondary | ICD-10-CM

## 2014-01-14 DIAGNOSIS — Z87718 Personal history of other specified (corrected) congenital malformations of genitourinary system: Secondary | ICD-10-CM | POA: Insufficient documentation

## 2014-01-14 DIAGNOSIS — L97509 Non-pressure chronic ulcer of other part of unspecified foot with unspecified severity: Secondary | ICD-10-CM | POA: Insufficient documentation

## 2014-01-14 DIAGNOSIS — Z8551 Personal history of malignant neoplasm of bladder: Secondary | ICD-10-CM | POA: Insufficient documentation

## 2014-01-14 DIAGNOSIS — L988 Other specified disorders of the skin and subcutaneous tissue: Secondary | ICD-10-CM | POA: Insufficient documentation

## 2014-01-14 DIAGNOSIS — M1710 Unilateral primary osteoarthritis, unspecified knee: Secondary | ICD-10-CM | POA: Diagnosis not present

## 2014-01-14 DIAGNOSIS — L819 Disorder of pigmentation, unspecified: Secondary | ICD-10-CM

## 2014-01-14 NOTE — ED Notes (Signed)
Pt stuck multiple times for blood, unable to obtain. Lab called to draw.

## 2014-01-14 NOTE — ED Notes (Signed)
Lab called from blood draw.

## 2014-01-14 NOTE — ED Notes (Signed)
Hospice RN, Joshua Gardner, would like to be notified when pt discharged or admitted.

## 2014-01-14 NOTE — ED Notes (Signed)
Hospice RN at bedside, sts that hands are purple and are never purple. Also noted pedal edema that is new and sts that his ankle are purple as well. Pt hands appear somewhat darkened in areas and are only slight cool to touch. Pt has some purple on ankles and slight edema. Pt pedal pulses palpable and strong. Pt A&O to baseline, which is only to person.

## 2014-01-14 NOTE — ED Notes (Signed)
Per EMS, [facility], Pt was evaluated by hospice nurse this morning and everything was WDL, then staff at 1900 noted him to have cold hands, cyanotic hands, and increased bilateral pedal edema. The nursing home called the supervisor and she came out and assessed and was very concerned because of these changes. Pt also seemed to have decreased weakness to L upper extremity. Pt's daughter was contacted and she asked to have him evaluated. Per EMS, pt has reddish, purplish hands that are only slight cool. It was difficult to get SPO2 to come up on his finger. Pt does not normally wear oxygen. Pt feet are edematous which is abnormal. Pt is on hospice for renal failure. Oriented to baseline.

## 2014-01-14 NOTE — ED Notes (Signed)
Bed: WA17 Expected date: 01/14/14 Expected time: 9:53 PM Means of arrival: Ambulance Comments: Altered mental status

## 2014-01-15 LAB — BASIC METABOLIC PANEL
BUN: 54 mg/dL — AB (ref 6–23)
CALCIUM: 9.1 mg/dL (ref 8.4–10.5)
CO2: 18 meq/L — AB (ref 19–32)
CREATININE: 1.86 mg/dL — AB (ref 0.50–1.35)
Chloride: 113 mEq/L — ABNORMAL HIGH (ref 96–112)
GFR calc Af Amer: 35 mL/min — ABNORMAL LOW (ref >90)
GFR calc non Af Amer: 30 mL/min — ABNORMAL LOW (ref >90)
Glucose, Bld: 101 mg/dL — ABNORMAL HIGH (ref 70–99)
Potassium: 4.9 mEq/L (ref 3.7–5.3)
Sodium: 144 mEq/L (ref 137–147)

## 2014-01-15 LAB — CBC WITH DIFFERENTIAL/PLATELET
Basophils Absolute: 0 10*3/uL (ref 0.0–0.1)
Basophils Relative: 0 % (ref 0–1)
EOS PCT: 8 % — AB (ref 0–5)
Eosinophils Absolute: 0.8 10*3/uL — ABNORMAL HIGH (ref 0.0–0.7)
HEMATOCRIT: 33.1 % — AB (ref 39.0–52.0)
Hemoglobin: 10.7 g/dL — ABNORMAL LOW (ref 13.0–17.0)
LYMPHS ABS: 1.4 10*3/uL (ref 0.7–4.0)
LYMPHS PCT: 16 % (ref 12–46)
MCH: 33.1 pg (ref 26.0–34.0)
MCHC: 32.3 g/dL (ref 30.0–36.0)
MCV: 102.5 fL — AB (ref 78.0–100.0)
MONO ABS: 0.9 10*3/uL (ref 0.1–1.0)
MONOS PCT: 10 % (ref 3–12)
Neutro Abs: 6 10*3/uL (ref 1.7–7.7)
Neutrophils Relative %: 66 % (ref 43–77)
Platelets: 191 10*3/uL (ref 150–400)
RBC: 3.23 MIL/uL — ABNORMAL LOW (ref 4.22–5.81)
RDW: 13.8 % (ref 11.5–15.5)
WBC: 9.2 10*3/uL (ref 4.0–10.5)

## 2014-01-15 LAB — BLOOD GAS, ARTERIAL
ACID-BASE DEFICIT: 7.3 mmol/L — AB (ref 0.0–2.0)
Bicarbonate: 16.6 mEq/L — ABNORMAL LOW (ref 20.0–24.0)
Drawn by: 11249
FIO2: 0.21 %
O2 Saturation: 96.7 %
Patient temperature: 98.6
TCO2: 15.4 mmol/L (ref 0–100)
pCO2 arterial: 29.9 mmHg — ABNORMAL LOW (ref 35.0–45.0)
pH, Arterial: 7.364 (ref 7.350–7.450)
pO2, Arterial: 91.9 mmHg (ref 80.0–100.0)

## 2014-01-15 LAB — PRO B NATRIURETIC PEPTIDE: Pro B Natriuretic peptide (BNP): 1436 pg/mL — ABNORMAL HIGH (ref 0–450)

## 2014-01-15 LAB — I-STAT CG4 LACTIC ACID, ED: Lactic Acid, Venous: 0.38 mmol/L — ABNORMAL LOW (ref 0.5–2.2)

## 2014-01-15 NOTE — Discharge Instructions (Signed)
Should hands or feet become discolored, wrap in warm blankets or towels to help with blood flow.  Continue with compression stocking usage.  Elevate feet when possible.   Edema Edema is an abnormal buildup of fluids. It is more common in your legs and thighs. Painless swelling of the feet and ankles is more likely as a person ages. It also is common in looser skin, like around your eyes. HOME CARE   Keep the affected body part above the level of the heart while lying down.  Do not sit still or stand for a long time.  Do not put anything right under your knees when you lie down.  Do not wear tight clothes on your upper legs.  Exercise your legs to help the puffiness (swelling) go down.  Wear elastic bandages or support stockings as told by your doctor.  A low-salt diet may help lessen the puffiness.  Only take medicine as told by your doctor. GET HELP IF:  Treatment is not working.  You have heart, liver, or kidney disease and notice that your skin looks puffy or shiny.  You have puffiness in your legs that does not get better when you raise your legs.  You have sudden weight gain for no reason. GET HELP RIGHT AWAY IF:   You have shortness of breath or chest pain.  You cannot breathe when you lie down.  You have pain, redness, or warmth in the areas that are puffy.  You have heart, liver, or kidney disease and get edema all of a sudden.  You have a fever and your symptoms get worse all of a sudden. MAKE SURE YOU:   Understand these instructions.  Will watch your condition.  Will get help right away if you are not doing well or get worse. Document Released: 12/22/2007 Document Revised: 07/10/2013 Document Reviewed: 04/27/2013 [facility] Patient Information 2015 [place], [place]. This information is not intended to replace advice given to you by your health care provider. Make sure you discuss any questions you have with your health care provider.  End-Stage Kidney  Disease The kidneys are two organs that lie on either side of the spine between the middle of the back and the front of the abdomen. The kidneys:   Remove wastes and extra water from the blood.   Produce important hormones. These help keep bones strong, regulate blood pressure, and help create red blood cells.   Balance the fluids and chemicals in the blood and tissues. End-stage kidney disease occurs when the kidneys are so damaged that they cannot do their job. When the kidneys cannot do their job, life-threatening problems occur. The body cannot stay clean and strong without the help of the kidneys. In end-stage kidney disease, the kidneys cannot get better.You need a new kidney or treatments to do some of the work healthy kidneys do in order to stay alive. CAUSES  End-stage kidney disease usually occurs when a long-lasting (chronic) kidney disease gets worse. It may also occur after the kidneys are suddenly damaged (acute kidney injury).  SYMPTOMS   Swelling (edema) of the legs, ankles, or feet.   Tiredness (lethargy).   Nausea or vomiting.   Confusion.   Problems with urination, such as:   Decreased urine production.   Frequent urination, especially at night.   Frequent accidents in children who are potty trained.   Muscle twitches and cramps.   Persistent itchiness.   Loss of appetite.   Headaches.   Abnormally dark or light skin.  Numbness in the hands or feet.   Easy bruising.   Frequent hiccups.   Menstruation stops. DIAGNOSIS  Your caregiver will measure your blood pressure and take some tests. These may include:   Urine tests.   Blood tests.   Imaging tests, such as:   An ultrasound exam.   Computed tomography (CT).  A kidney biopsy. TREATMENT  There are two treatments for end-stage kidney disease:   A procedure that removes toxic wastes from the body (dialysis).   Receiving a new kidney (kidney transplant). Both of  these treatments have serious risks and consequences. Your caregiver will help you determine which treatment is best for you based on your health, age, and other factors. In addition to having dialysis or a kidney transplant, you may need to take medicines to control high blood pressure (hypertension) and cholesterol and to decrease phosphorus levels in your blood.  HOME CARE INSTRUCTIONS  Follow your prescribed diet.   Only take over-the-counter or prescription medicines as directed by your caregiver.   Do not take any new medicines (prescription, over-the-counter, or nutritional supplements) unless approved by your caregiver. Many medicines can worsen your kidney damage or need to have the dose adjusted.   Keep all follow-up appointments. MAKE SURE YOU:  Understand these instructions.  Will watch your condition.  Will get help right away if you are not doing well or get worse Document Released: 09/25/2003 Document Revised: 06/21/2012 Document Reviewed: 03/03/2012 [facility] Patient Information 2015 [place]. This information is not intended to replace advice given to you by your health care provider. Make sure you discuss any questions you have with your health care provider.  Pressure Ulcer A pressure ulcer is a sore that has formed from the breakdown of skin and exposure of deeper layers of tissue. It develops in areas of the body where there is unrelieved pressure. Pressure ulcers are usually found over a bony area, such as the shoulder blades, spine, lower back, hips, knees, ankles, and heels. Pressure ulcers vary in severity. Your health care provider may determine the severity (stage) of your pressure ulcer. The stages include:  Stage I--The skin is red, and when the skin is pressed, it stays red.  Stage II--The top layer of skin is gone, and there is a shallow, pink ulcer.  Stage III--The ulcer becomes deeper, and it is more difficult to see the whole wound. Also, there  may be yellow or brown parts, as well as pink and red parts.  Stage IV--The ulcer may be deep and red, pink, brown, white, or yellow. Bone or muscle may be seen.  Unstageable pressure ulcer--The ulcer is covered almost completely with black, brown, or yellow tissue. It is not known how deep the ulcer is or what stage it is until this covering comes off.  Suspected deep tissue injury--A person's skin can be injured from pressure or pulling on the skin when his or her position is changed. The skin appears purple or maroon. There may not be an opening in the skin, but there could be a blood-filled blister. This deep tissue injury is often difficult to see in people with darker skin tones. The site may open and become deeper in time. However, early interventions will help the area heal and may prevent the area from opening. CAUSES  Pressure ulcers are caused by pressure against the skin that limits the flow of blood to the skin and nearby tissues. There are many risk factors that can lead to pressure sores. RISK  FACTORS  Decreased ability to move.  Decreased ability to feel pain or discomfort.  Excessive skin moisture from urine, stool, sweat, or secretions.  Poor nutrition.  Dehydration.  Tobacco, drug, or alcohol abuse.  Having someone pull on bedsheets that are under you, such as when health care workers are changing your position in a hospital bed.  Obesity.  Increased adult age.  Hospitalization in a critical care unit for longer than 4 days with use of medical devices.  Prolonged use of medical devices.  Critical illness.  Anemia.  Traumatic brain injury.  Spinal cord injury.  Stroke.  Diabetes.  Poor blood glucose control.  Low blood pressure (hypotension).  Low oxygen levels.  Medicines that reduce blood flow.  Infection. DIAGNOSIS  Your health care provider will diagnose your pressure ulcer based on its appearance. The health care provider may determine the  stage of your pressure ulcer as well. Tests may be done to check for infection, to assess your circulation, or to check for other diseases, such as diabetes. TREATMENT  Treatment of your pressure ulcer begins with determining what stage the ulcer is in. Your treatment team may include your health care provider, a wound care specialist, a nutritionist, a physical therapist, and a Careers advisersurgeon. Possible treatments may include:   Moving or repositioning every 1-2 hours.  Using beds or mattresses to shift your body weight and pressure points frequently.  Improving your diet.  Cleaning and bandaging (dressing) the open wound.  Giving antibiotic medicines.  Removing damaged tissue.  Surgery and sometimes skin grafts. HOME CARE INSTRUCTIONS  If you were hospitalized, follow the care plan that was started in the hospital.  Avoid staying in the same position for more than 2 hours. Use padding, devices, or mattresses to cushion your pressure points as directed by your health care provider.  Eat a well-balanced diet. Take nutritional supplements and vitamins as directed by your health care provider.  Keep all follow-up appointments.  Only take over-the-counter or prescription medicines for pain, fever, or discomfort as directed by your health care provider. SEEK MEDICAL CARE IF:   Your pressure ulcer is not improving.  You do not know how to care for your pressure ulcer.  You notice other areas of redness on your skin.  You have a fever. SEEK IMMEDIATE MEDICAL CARE IF:   You have increasing redness, swelling, or pain in your pressure ulcer.  You notice pus coming from your pressure ulcer.  You notice a bad smell coming from the wound or dressing.  Your pressure ulcer opens up again. Document Released: 07/05/2005 Document Revised: 07/10/2013 Document Reviewed: 03/12/2013 [facility] Patient Information 2015 [place], [place]. This information is not intended to replace advice given to you by  your health care provider. Make sure you discuss any questions you have with your health care provider.

## 2014-01-15 NOTE — ED Provider Notes (Signed)
CSN: [id number]     Arrival date & time 01/14/14  2204 History   First MD Initiated Contact with Patient 01/14/14 2254     Chief Complaint  Patient presents with  . Cyanosis     (Consider location/radiation/quality/duration/timing/severity/associated sxs/prior Treatment) HPI 80 year old male presents to emergency room from his nursing facility with complaint of cyanosis to hands and feet.  Per report, there has been ongoing throughout the day today.  Patient is demented, on hospice currently for end-stage renal disease and dementia.  Hospice nurses at bedside.  She reports that patient's hands and feet are never blue, she reports that they have improved slightly since arrival in the emergency department and now are purple.  Nurse is also concerned about swelling in his feet bilaterally, reports he has never had feet swelling.  She reports that they put compression stockings on him daily and after removing them today he had swelling.  Patient has not had any other symptoms, no fever chills nausea vomiting.  Patient has dementia, cannot contribute to history Past Medical History  Diagnosis Date  . History of bladder cancer   . Dementia     With hallucinations/paranoia.  July 2013 CT brain: atrophy + small vessel ischemic demyelination  . Osteoarthritis of both knees   . Urinary incontinence   . History of non anemic vitamin B12 deficiency 08/2011 (level was 171)    Vit B12 level normal (466) 11/2011 at previous PCP.  Marland Kitchen. History of hypothyroidism     currently off meds  . History of hypertension     Normotensive off meds as of 11/2011  . Chronic renal insufficiency, stage III (moderate)     Borderline stage II/III (Est CrCl about 60).  . H/O phimosis 2012/2013    Urology recommended circumcision.  Cardiology cleared him for surgery 08/09/2012.  . 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 Hernia repair    . Shoulder surgery      Right  . Cholecystectomy  1980s  . Venous duplex doppler of left upper extremity  05/2013    NEG for DVT or superficial venous thrombosis.   Family History  Problem Relation Age of Onset  . Congestive Heart Failure Mother    History  Substance Use Topics  . Smoking status: Never Smoker   . Smokeless tobacco: Never Used  . Alcohol Use: No    Review of Systems  Unable to perform ROS: Dementia      Allergies  Keflex and Tramadol  Home Medications   Prior to Admission medications   Medication Sig Start Date End Date Taking? Authorizing Provider  acetaminophen (TYLENOL) 500 MG tablet Take 1,000 mg by mouth 3 (three) times daily.   Yes Historical Provider, MD  carbidopa-levodopa (SINEMET IR) 25-100 MG per tablet Take 0.5 tablets by mouth 3 (three) times daily.   Yes Historical Provider, MD  cyanocobalamin 500 MCG tablet Take 1,000 mcg by mouth every morning.   Yes Historical Provider, MD  donepezil (ARICEPT) 5 MG tablet Take 5 mg by mouth at bedtime.   Yes Historical Provider, MD  feeding supplement (ENSURE IMMUNE HEALTH) LIQD Take 237 mLs by mouth 2 (two) times daily. Vanilla flavor   Yes Historical Provider, MD  folic acid (FOLVITE) 1 MG tablet Take 1 mg by mouth every morning.   Yes Historical Provider, MD  guaiFENesin (MUCINEX) 600 MG 12 hr tablet Take  600 mg by mouth every 12 (twelve) hours as needed for cough.   Yes Historical Provider, MD  ipratropium (ATROVENT) 0.03 % nasal spray Place 1 spray into both nostrils 3 (three) times daily.   Yes Historical Provider, MD  lidocaine (LIDODERM) 5 % Place 1 patch onto the skin daily as needed (pain). Remove & Discard patch within 12 hours or as directed by MD   Yes Historical Provider, MD  lisinopril (PRINIVIL,ZESTRIL) 10 MG tablet Take 10 mg by mouth every morning.   Yes Historical Provider, MD  Polyethyl Glycol-Propyl Glycol (SYSTANE) 0.4-0.3 % SOLN Place 2 drops into both eyes 3  (three) times daily.   Yes Historical Provider, MD   BP 145/60  Temp(Src) 97.5 F (36.4 C) (Oral)  Resp 17  SpO2 100% Physical Exam  Nursing note and vitals reviewed. Constitutional: He appears well-developed and well-nourished. No distress.  Frail elderly male no acute distress  HENT:  Head: Normocephalic and atraumatic.  Right Ear: External ear normal.  Left Ear: External ear normal.  Nose: Nose normal.  Mouth/Throat: Oropharynx is clear and moist.  Eyes: Conjunctivae and EOM are normal. Pupils are equal, round, and reactive to light.  Neck: Normal range of motion. Neck supple. No JVD present. No tracheal deviation present. No thyromegaly present.  Cardiovascular: Normal rate, regular rhythm, normal heart sounds and intact distal pulses.  Exam reveals no gallop and no friction rub.   No murmur heard. Pulmonary/Chest: Effort normal and breath sounds normal. No stridor. No respiratory distress. He has no wheezes. He has no rales. He exhibits no tenderness.  Abdominal: Soft. Bowel sounds are normal. He exhibits no distension and no mass. There is no tenderness. There is no rebound and no guarding.  Musculoskeletal: Normal range of motion. He exhibits edema. He exhibits no tenderness.  Patient has 1+ edema and right foot.  He has a 2 cm pressure sore to the lateral aspect with some slight erythema around the site.  Patient has slowed cap refill, about 4-5 seconds in the extremities.  He does have some very mild mottling, but no cyanosis or extreme discoloration.  Extremities are cool but not cold or clammy.  Pulses are 2+  Lymphadenopathy:    He has no cervical adenopathy.  Skin: Skin is warm and dry. No rash noted. No erythema. No pallor.    ED Course  Procedures (including critical care time) Labs Review Labs Reviewed  CBC WITH DIFFERENTIAL - Abnormal; Notable for the following:    RBC 3.23 (*)    Hemoglobin 10.7 (*)    HCT 33.1 (*)    MCV 102.5 (*)    Eosinophils Relative 8  (*)    Eosinophils Absolute 0.8 (*)    All other components within normal limits  BASIC METABOLIC PANEL - Abnormal; Notable for the following:    Chloride 113 (*)    CO2 18 (*)    Glucose, Bld 101 (*)    BUN 54 (*)    Creatinine, Ser 1.86 (*)    GFR calc non Af Amer 30 (*)    GFR calc Af Amer 35 (*)    All other components within normal limits  PRO B NATRIURETIC PEPTIDE - Abnormal; Notable for the following:    Pro B Natriuretic peptide (BNP) 1436.0 (*)    All other components within normal limits  BLOOD GAS, ARTERIAL - Abnormal; Notable for the following:    pCO2 arterial 29.9 (*)    Bicarbonate 16.6 (*)    Acid-base  deficit 7.3 (*)    All other components within normal limits  I-STAT CG4 LACTIC ACID, ED - Abnormal; Notable for the following:    Lactic Acid, Venous 0.38 (*)    All other components within normal limits    Imaging Review No results found.   EKG Interpretation None      MDM   Final diagnoses:  Chronic renal insufficiency, stage III (moderate)  Peripheral edema  Skin ulcer of right foot, limited to breakdown of skin  Mottled skin    80 year old male with mild mottling of the extremities.  Vitals here are normal.  Plan for labs, reassess    Olivia Mackie, MD 01/15/14 [phone]

## 2014-01-15 NOTE — ED Notes (Addendum)
[facility] Unable to speak with someone at this time. RN left message for someone to call regarding resident. Will continue to try back.

## 2014-01-15 NOTE — ED Notes (Signed)
[facility] arrived to transport patient. 

## 2014-02-14 ENCOUNTER — Telehealth: Payer: Self-pay | Admitting: Family Medicine

## 2014-02-14 MED ORDER — FOLIC ACID 1 MG PO TABS
1.0000 mg | ORAL_TABLET | Freq: Every morning | ORAL | Status: AC
Start: 2014-02-14 — End: ?

## 2014-02-14 MED ORDER — CYANOCOBALAMIN 500 MCG PO TABS: 1000.0000 ug | ORAL_TABLET | Freq: Every morning | ORAL | Status: DC

## 2014-02-14 NOTE — Telephone Encounter (Signed)
[facility]

## 2014-02-14 NOTE — Telephone Encounter (Signed)
[place] [place]

## 2014-02-22 ENCOUNTER — Encounter: Payer: Self-pay | Admitting: Neurology

## 2014-02-22 ENCOUNTER — Ambulatory Visit (INDEPENDENT_AMBULATORY_CARE_PROVIDER_SITE_OTHER): Admitting: Neurology

## 2014-02-22 VITALS — BP 92/60 | HR 72 | Resp 14

## 2014-02-22 DIAGNOSIS — G20 Parkinson's disease: Secondary | ICD-10-CM

## 2014-02-22 DIAGNOSIS — R1310 Dysphagia, unspecified: Secondary | ICD-10-CM

## 2014-02-22 DIAGNOSIS — F0280 Dementia in other diseases classified elsewhere without behavioral disturbance: Secondary | ICD-10-CM

## 2014-02-22 NOTE — Progress Notes (Signed)
Joshua Gardner was seen today in the movement disorders clinic for neurologic consultation at the request of MCGOWEN,PHILIP H, MD.  The consultation is for the evaluation of parkinsonism and memory loss.  Pt presents with daughter who supplements and supplies most of the the hx.  Pt has placed in assisted living since nov 2013, when his daughter would go over and find him forgetting to eat and with urinary and fecal incontinence.  His wife died in 2012 but it is believed that he had memory loss before that but that his wife "covered" for him.  However, the memory changes escalated after the death of his wife.  He was walking prior to the death of his wife, but once placed into ECF, he quit ambulating.  His daughter believes that [facility]   02/22/14 update:  The patient was seen in followup today.  He was diagnosed with PD with dementia last visit, in May 2015.  I was very reluctant to start him on levodopa because of advanced dementia with hallucinations and the fact that he is nonambulatory.  However, his daughter wanted him to try the medication, so we initiated it.  His daughter states that hallucinations have gotten worse.  He also seems to be very apraxic with commands when his daughter attempts to transfer him.  His daughter does state that he no longer c/o pain in the right knee and she thinks that is due to the levodopa.  His daughter does state that he is more alert and talking more during the day and thinks that this also is from the levodopa.  I did review records since our last visit that are available to me.  His daughter called his primary care physician complaining that the nursing facility was not giving him his Aricept, so his primary care physician initially was going to discontinue it (which I agree with given advanced disease).  However, his daughter wanted him on the medication, so he is now back on the Aricept.  He did visit the emergency  room on June 29 because of discoloration of the hands/feet.  The emergency room felt that he was stable and the diagnosis was skin mottling.  PREVIOUS MEDICATIONS: none to date  ALLERGIES:   Allergies  Allergen Reactions  . Keflex [Cephalexin] Other (See Comments)    Per MAR  . Tramadol Other (See Comments)    Delirium    CURRENT MEDICATIONS:  Current Outpatient Prescriptions on File Prior to Visit  Medication Sig Dispense Refill  . acetaminophen (TYLENOL) 500 MG tablet Take 1,000 mg by mouth 3 (three) times daily.      . carbidopa-levodopa (SINEMET IR) 25-100 MG per tablet Take 0.5 tablets by mouth 3 (three) times daily.      . cyanocobalamin 500 MCG tablet Take 2 tablets (1,000 mcg total) by mouth every morning.  180 tablet  1  . donepezil (ARICEPT) 5 MG tablet Take 5 mg by mouth at bedtime.      . feeding supplement (ENSURE IMMUNE HEALTH) LIQD Take 237 mLs by mouth 2 (two) times daily. Vanilla flavor      . folic acid (FOLVITE) 1 MG tablet Take 1 tablet (1 mg total) by mouth every morning.  90 tablet  1  . guaiFENesin (MUCINEX) 600 MG 12 hr tablet Take 600 mg by mouth every 12 (twelve) hours as needed for cough.      Marland Kitchen. ipratropium (ATROVENT) 0.03 % nasal spray Place  1 spray into both nostrils 3 (three) times daily.      Marland Kitchen lisinopril (PRINIVIL,ZESTRIL) 10 MG tablet Take 10 mg by mouth every morning.      Bertram Gala Glycol-Propyl Glycol (SYSTANE) 0.4-0.3 % SOLN Place 2 drops into both eyes 3 (three) times daily.       No current facility-administered medications on file prior to visit.    PAST MEDICAL HISTORY:   Past Medical History  Diagnosis Date  . History of bladder cancer   . Dementia     With hallucinations/paranoia.  July 2013 CT brain: atrophy + small vessel ischemic demyelination  . Osteoarthritis of both knees   . Urinary incontinence   . History of non anemic vitamin B12 deficiency 08/2011 (level was 171)    Vit B12 level normal (466) 11/2011 at previous PCP.  Marland Kitchen  History of hypothyroidism     currently off meds  . History of hypertension     Normotensive off meds as of 11/2011  . Chronic renal insufficiency, stage III (moderate)     Borderline stage II/III (Est CrCl about 60).  . H/O phimosis 2012/2013    Urology recommended circumcision.  Cardiology cleared him for surgery 08/09/2012.  . Osteoarthritis of left wrist 05/2013    with subtle erosive changes--chronic left hand/wrist swelling    PAST SURGICAL HISTORY:   Past Surgical History  Procedure Laterality Date  . Hernia repair    . Shoulder surgery      Right  . Cholecystectomy  1980s  . Venous duplex doppler of left upper extremity  05/2013    NEG for DVT or superficial venous thrombosis.    SOCIAL HISTORY:   History   Social History  . Marital Status: Widowed    Spouse Name: N/A    Number of Children: N/A  . Years of Education: N/A   Occupational History  . retired     worked at [employer]   Social History Main Topics  . Smoking status: Never Smoker   . Smokeless tobacco: Never Used  . Alcohol Use: No  . Drug Use: No  . Sexual Activity: Not on file   Other Topics Concern  . Not on file   Social History Narrative   Widower as of 07/2010, had 4 children, two of which are living.   L[facility] in [facility]   Daughter Claudell Kyle is in charge of his care.   Essentially confined to wheelchair due to severe knee osteoarthritis.   No T/A/Ds.                FAMILY HISTORY:   Family Status  Relation Status Death Age  . Mother Deceased 11    CHF  . Father Deceased     61 y/o from gangrene  . Sister Alive   . Sister Alive   . Sister Alive   . Sister Deceased     CHF  . Brother Deceased     colon cancer  . Brother Deceased     brain cancer  . Brother Deceased     emphysema    ROS:  Pt doesn't participate any of ROS today  PHYSICAL EXAMINATION:    VITALS:   Filed Vitals:   02/22/14 0847  BP: 92/60  Pulse: 72    Resp: 14   Pt sleeps during much of visit and is only intermittenly cooperative with commands.    GEN:  The patient appears stated age and is in  NAD. HEENT:  Normocephalic, atraumatic.  The mucous membranes are dry. The superficial temporal arteries are without ropiness or tenderness. CV:  RRR Lungs:  CTAB Neck/HEME:  There are no carotid bruits bilaterally.  Neurological examination:  Orientation: A complete MMSE was done on 11/16/13 and the patient scored a 6/30.  He does not participate today with questions of orientation and is very sleepy Cranial nerves: There is good facial symmetry. He does not participate with extraocular muscle testing.  He does not take part in formal confrontational visual field testing, but does blink to visual menace .  Does not initiate any speech today.  Soft palate does rises symmetrically.  Hearing does appear to be intact to conversational tone.  Sensation: Doesn't participate with formal aspects of the sensory testing. Deep tendon reflexes: Not tested today Motor: Strength is good and equal bilaterally.  Strength is at least antigravity x4, but does not participate with more sensitive aspects.  Movement examination: Tone: There is mild increased tone in the left upper extremity.  Tone in the right upper and bilateral lower extremities is normal.   Abnormal movements: No tremor noted today Coordination:  Patient does not participate Gait and Station: The patient cannot ambulate and is wheelchair bound.  He does not transfer without significant assistance.      ASSESSMENT/PLAN:   1.  Idiopathic Parkinson's disease.  I wanted to discontinue the levodopa, as I do not think that is offering significant benefit, and he has had some increase in hallucinations with it.  He has already had baseline hallucinations.  He is only on half a tablet 3 times per day, as I was leery about starting it in the first place.  However, his daughter really wants to keep him on  the medication, as she believes it has made him more alert.  She completely understands risks and benefits of this decision. 2.  Advanced dementia  -Still not sure if the aricept is helping with a MoCA of only 6 but his daughter thinks that it does, so I did not change it.  She asked me about going up on the Aricept, but I advised against it.  -Pt in hospice currently 3.  liquid dysphagia.  -Patient's daughter reports that he is choking on liquids.  We talked about doing a modified barium swallow, but she did not want to proceed with this.  Talked about just going ahead and thickening up his liquids, but she did not want to proceed with that either.  Talked about the fact that he is at risk for aspiration pneumonia and the ramifications associated with that.  She was agreeable to a bedside swallow eval, if that was available.  I will see if that is available at his nursing home.  Greater than 50% of the visit (25 minutes) and education with the patient's daughter. 4.  Return in about 6 months (around 08/25/2014).

## 2014-02-28 ENCOUNTER — Telehealth: Payer: Self-pay | Admitting: Neurology

## 2014-02-28 MED ORDER — CARBIDOPA-LEVODOPA 25-100 MG PO TABS: 0.5000 | ORAL_TABLET | Freq: Three times a day (TID) | ORAL | Status: AC

## 2014-02-28 NOTE — Telephone Encounter (Signed)
Received a call a nurse report that patient never received RX for Levodopa. Advised this was never requested - but last office note states to continue. [facility]

## 2014-03-23 ENCOUNTER — Emergency Department (HOSPITAL_COMMUNITY): Payer: Medicare Other

## 2014-03-23 ENCOUNTER — Emergency Department (HOSPITAL_COMMUNITY)
Admission: EM | Admit: 2014-03-23 | Discharge: 2014-03-23 | Disposition: A | Discharge status: Home / Self Care | Payer: Medicare Other | Attending: Emergency Medicine | Admitting: Emergency Medicine

## 2014-03-23 ENCOUNTER — Encounter (HOSPITAL_COMMUNITY): Payer: Self-pay | Admitting: Emergency Medicine

## 2014-03-23 DIAGNOSIS — Z8551 Personal history of malignant neoplasm of bladder: Secondary | ICD-10-CM | POA: Diagnosis not present

## 2014-03-23 DIAGNOSIS — W06XXXA Fall from bed, initial encounter: Secondary | ICD-10-CM | POA: Diagnosis not present

## 2014-03-23 DIAGNOSIS — Z79899 Other long term (current) drug therapy: Secondary | ICD-10-CM | POA: Insufficient documentation

## 2014-03-23 DIAGNOSIS — F0390 Unspecified dementia without behavioral disturbance: Secondary | ICD-10-CM | POA: Insufficient documentation

## 2014-03-23 DIAGNOSIS — IMO0002 Reserved for concepts with insufficient information to code with codable children: Secondary | ICD-10-CM | POA: Insufficient documentation

## 2014-03-23 DIAGNOSIS — S59909A Unspecified injury of unspecified elbow, initial encounter: Secondary | ICD-10-CM | POA: Insufficient documentation

## 2014-03-23 DIAGNOSIS — M19039 Primary osteoarthritis, unspecified wrist: Secondary | ICD-10-CM | POA: Insufficient documentation

## 2014-03-23 DIAGNOSIS — N183 Chronic kidney disease, stage 3 unspecified: Secondary | ICD-10-CM | POA: Insufficient documentation

## 2014-03-23 DIAGNOSIS — Z862 Personal history of diseases of the blood and blood-forming organs and certain disorders involving the immune mechanism: Secondary | ICD-10-CM | POA: Diagnosis not present

## 2014-03-23 DIAGNOSIS — S51011A Laceration without foreign body of right elbow, initial encounter: Secondary | ICD-10-CM

## 2014-03-23 DIAGNOSIS — Y9389 Activity, other specified: Secondary | ICD-10-CM | POA: Diagnosis not present

## 2014-03-23 DIAGNOSIS — E538 Deficiency of other specified B group vitamins: Secondary | ICD-10-CM | POA: Insufficient documentation

## 2014-03-23 DIAGNOSIS — S1093XA Contusion of unspecified part of neck, initial encounter: Secondary | ICD-10-CM

## 2014-03-23 DIAGNOSIS — M1710 Unilateral primary osteoarthritis, unspecified knee: Secondary | ICD-10-CM | POA: Insufficient documentation

## 2014-03-23 DIAGNOSIS — S59919A Unspecified injury of unspecified forearm, initial encounter: Secondary | ICD-10-CM | POA: Diagnosis present

## 2014-03-23 DIAGNOSIS — S51009A Unspecified open wound of unspecified elbow, initial encounter: Secondary | ICD-10-CM | POA: Insufficient documentation

## 2014-03-23 DIAGNOSIS — S0083XA Contusion of other part of head, initial encounter: Secondary | ICD-10-CM | POA: Diagnosis not present

## 2014-03-23 DIAGNOSIS — Z87718 Personal history of other specified (corrected) congenital malformations of genitourinary system: Secondary | ICD-10-CM | POA: Diagnosis not present

## 2014-03-23 DIAGNOSIS — S0003XA Contusion of scalp, initial encounter: Secondary | ICD-10-CM | POA: Diagnosis not present

## 2014-03-23 DIAGNOSIS — S6990XA Unspecified injury of unspecified wrist, hand and finger(s), initial encounter: Secondary | ICD-10-CM

## 2014-03-23 DIAGNOSIS — Z8639 Personal history of other endocrine, nutritional and metabolic disease: Secondary | ICD-10-CM | POA: Insufficient documentation

## 2014-03-23 DIAGNOSIS — Y929 Unspecified place or not applicable: Secondary | ICD-10-CM | POA: Diagnosis not present

## 2014-03-23 NOTE — ED Notes (Signed)
Phone call received from Joshua Gardner, reported she is pt's daughter and POA.  Phone number [phone].  Pt's baseline mental status is dementia, will not respond if he is sleepy. Pt is slightly hearing impaired. Rn will continue to monitor pt.

## 2014-03-23 NOTE — ED Notes (Signed)
Bed: ZO10 Expected date: 03/23/14 Expected time: 11:12 AM Means of arrival: Ambulance Comments: Elderly fall, lacerations

## 2014-03-23 NOTE — ED Provider Notes (Addendum)
CSN: [id number]     Arrival date & time 03/23/14  1117 History   First MD Initiated Contact with Patient 03/23/14 1123     Chief Complaint  Patient presents with  . Fall     Level V caveat: Dementia   HPI Patient is brought to the emergency department after an unwitnessed fall off of his bed this morning.  He is a resident of [facility].  He presents the emergency department without major complaint.  Baseline mental status per nursing facility.  No reports of recent fever or diarrhea.  History of chronic pain.  EMS reported no deformities.  Patient does have a small abrasion on his left cheek and a skin tear on his right elbow.  Also presents with what appears to be old bruising of his left upper extremity   Past Medical History  Diagnosis Date  . History of bladder cancer   . Dementia     With hallucinations/paranoia.  July 2013 CT brain: atrophy + small vessel ischemic demyelination  . Osteoarthritis of both knees   . Urinary incontinence   . History of non anemic vitamin B12 deficiency 08/2011 (level was 171)    Vit B12 level normal (466) 11/2011 at previous PCP.  Marland Kitchen History of hypothyroidism     currently off meds  . History of hypertension     Normotensive off meds as of 11/2011  . Chronic renal insufficiency, stage III (moderate)     Borderline stage II/III (Est CrCl about 60).  . H/O phimosis 2012/2013    Urology recommended circumcision.  Cardiology cleared him for surgery 08/09/2012.  . 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 Hernia repair    . Shoulder surgery      Right  . Cholecystectomy  1980s  . Venous duplex doppler of left upper extremity  05/2013    NEG for DVT or superficial venous thrombosis.   Family History  Problem Relation Age of Onset  . Congestive Heart Failure Mother    History  Substance Use Topics  . Smoking status: Never  Smoker   . Smokeless tobacco: Never Used  . Alcohol Use: No    Review of Systems  Unable to perform ROS: Dementia      Allergies  Keflex and Tramadol  Home Medications   Prior to Admission medications   Medication Sig Start Date End Date Taking? Authorizing Provider  acetaminophen (TYLENOL) 500 MG tablet Take 1,000 mg by mouth 3 (three) times daily.   Yes Historical Provider, MD  carbidopa-levodopa (SINEMET IR) 25-100 MG per tablet Take 0.5 tablets by mouth 3 (three) times daily. 02/28/14  Yes Rebecca S Tat, DO  donepezil (ARICEPT) 5 MG tablet Take 5 mg by mouth at bedtime.   Yes Historical Provider, MD  feeding supplement (ENSURE IMMUNE HEALTH) LIQD Take 237 mLs by mouth 2 (two) times daily. Vanilla flavor   Yes Historical Provider, MD  folic acid (FOLVITE) 1 MG tablet Take 1 tablet (1 mg total) by mouth every morning. 02/14/14  Yes Jeoffrey Massed, MD  ipratropium (ATROVENT) 0.03 % nasal spray Place 1 spray into both nostrils 3 (three) times daily.   Yes Historical Provider, MD  lisinopril (PRINIVIL,ZESTRIL) 10 MG tablet Take 10 mg by mouth every morning.   Yes Historical Provider, MD  Polyethyl Glycol-Propyl Glycol (SYSTANE) 0.4-0.3 % SOLN Place 2 drops into both eyes 3 (three)  times daily.   Yes Historical Provider, MD  vitamin B-12 (CYANOCOBALAMIN) 1000 MCG tablet Take 1,000 mcg by mouth daily.   Yes Historical Provider, MD  guaiFENesin (MUCINEX) 600 MG 12 hr tablet Take 600 mg by mouth every 12 (twelve) hours as needed for cough.    Historical Provider, MD   BP 125/73  Pulse 82  Temp(Src) 97.7 F (36.5 C) (Oral)  Resp 18  SpO2 96% Physical Exam  Nursing note and vitals reviewed. Constitutional: He appears well-developed and well-nourished.  HENT:  Head: Normocephalic and atraumatic.  Eyes: EOM are normal.  Neck: Normal range of motion.  Cardiovascular: Normal rate, regular rhythm and normal heart sounds.   Pulmonary/Chest: Effort normal and breath sounds normal. No  respiratory distress.  Abdominal: Soft. He exhibits no distension. There is no tenderness.  Musculoskeletal: Normal range of motion.  Bruising of the left upper extremity.  Some tenderness to palpation of the left proximal humerus.  No obvious deformity about the left shoulder.  Normal left radial pulse.  Full range of motion bilateral hips.  Small skin tear on the lateral aspect of the right elbow with full range of motion of the right elbow and right wrist and right shoulder.  Neurological: He is alert.  Moves arms and legs although limited secondary to contractures  Skin: Skin is warm and dry.  Psychiatric: He has a normal mood and affect. Judgment normal.    ED Course  Procedures (including critical care time) Labs Review Labs Reviewed - No data to display  Imaging Review Dg Pelvis 1-2 Views  03/23/2014   CLINICAL DATA:  Fall off bed, pelvic pain  EXAM: PELVIS - 1-2 VIEW  COMPARISON:  10/16/2013  FINDINGS: There is no evidence of pelvic fracture or diastasis. No other pelvic bone lesions are seen. Suboptimal positioning due to patient immobility. Bones are subjectively osteopenic. Lumbar spine disc degenerative change partly visualized. Moderate stool noted over the rectum.  IMPRESSION: No displaced pelvic fracture.   Electronically Signed   By: Christiana Pellant M.D.   On: 03/23/2014 12:34   Ct Head Wo Contrast  03/23/2014   CLINICAL DATA:  Unwitnessed fall, left hand laceration, dementia  EXAM: CT HEAD WITHOUT CONTRAST  CT CERVICAL SPINE WITHOUT CONTRAST  TECHNIQUE: Multidetector CT imaging of the head and cervical spine was performed following the standard protocol without intravenous contrast. Multiplanar CT image reconstructions of the cervical spine were also generated.  COMPARISON:  05/26/2013  FINDINGS: CT HEAD FINDINGS  No evidence of parenchymal hemorrhage or extra-axial fluid collection. No mass lesion, mass effect, or midline shift.  No CT evidence of acute infarction.  Subcortical  white matter and periventricular small vessel ischemic changes. Intracranial atherosclerosis.  Partial opacification of the bilateral frontal, ethmoid, and maxillary sinuses. Mastoid air cells are clear.  No evidence of calvarial fracture.  CT CERVICAL SPINE FINDINGS  Straightening of the cervical spine.  No evidence of fracture or dislocation. Vertebral body heights and intervertebral disc spaces are maintained. Dens appears intact.  No prevertebral soft tissue swelling.  Mild to moderate multilevel degenerative changes.  Visualized thyroid is unremarkable.  Visualized lung apices are essentially clear.  IMPRESSION: No evidence of acute intracranial abnormality. Atrophy with small vessel ischemic changes and intracranial atherosclerosis.  No evidence of traumatic injury to the cervical spine. Mild to moderate degenerative changes.   Electronically Signed   By: Charline Bills M.D.   On: 03/23/2014 12:15   Ct Cervical Spine Wo Contrast  03/23/2014  CLINICAL DATA:  Unwitnessed fall, left hand laceration, dementia  EXAM: CT HEAD WITHOUT CONTRAST  CT CERVICAL SPINE WITHOUT CONTRAST  TECHNIQUE: Multidetector CT imaging of the head and cervical spine was performed following the standard protocol without intravenous contrast. Multiplanar CT image reconstructions of the cervical spine were also generated.  COMPARISON:  05/26/2013  FINDINGS: CT HEAD FINDINGS  No evidence of parenchymal hemorrhage or extra-axial fluid collection. No mass lesion, mass effect, or midline shift.  No CT evidence of acute infarction.  Subcortical white matter and periventricular small vessel ischemic changes. Intracranial atherosclerosis.  Partial opacification of the bilateral frontal, ethmoid, and maxillary sinuses. Mastoid air cells are clear.  No evidence of calvarial fracture.  CT CERVICAL SPINE FINDINGS  Straightening of the cervical spine.  No evidence of fracture or dislocation. Vertebral body heights and intervertebral disc spaces  are maintained. Dens appears intact.  No prevertebral soft tissue swelling.  Mild to moderate multilevel degenerative changes.  Visualized thyroid is unremarkable.  Visualized lung apices are essentially clear.  IMPRESSION: No evidence of acute intracranial abnormality. Atrophy with small vessel ischemic changes and intracranial atherosclerosis.  No evidence of traumatic injury to the cervical spine. Mild to moderate degenerative changes.   Electronically Signed   By: Charline Bills M.D.   On: 03/23/2014 12:15   Dg Humerus Left  03/23/2014   CLINICAL DATA:  Fall off bed, trauma  EXAM: LEFT HUMERUS - 2+ VIEW  COMPARISON:  None.  FINDINGS: There is no evidence of fracture or other focal bone lesions. Soft tissues are unremarkable. Bones are subjectively osteopenic. Mild osteophytosis and degenerative change at the humeral head is identified.  IMPRESSION: No displaced fracture allowing for positioning and degree of osteopenia.   Electronically Signed   By: Christiana Pellant M.D.   On: 03/23/2014 12:33  I personally reviewed the imaging tests through PACS system I reviewed available ER/hospitalization records through the EMR    EKG Interpretation None      MDM   Final diagnoses:  None    Fall with contusions.  CT head C-spine normal.  Plain films of the pelvis are normal.  Plain film of the left humerus is without significant abnormality.  Skin tears are minimal.  Discharge home with symptomatic care for his skin tear.  PCP followup.    Lyanne Co, MD 03/23/14 1312  Lyanne Co, MD 03/23/14 [id number]

## 2014-03-23 NOTE — ED Notes (Signed)
Patient transported to X-ray 

## 2014-03-23 NOTE — Discharge Instructions (Signed)
Skin Tear Care  A skin tear is a wound in which the top layer of skin has peeled off. This is a common problem with aging because the skin becomes thinner and more fragile as a person gets older. In addition, some medicines, such as oral corticosteroids, can lead to skin thinning if taken for long periods of time.   A skin tear is often repaired with tape or skin adhesive strips. This keeps the skin that has been peeled off in contact with the healthier skin beneath. Depending on the location of the wound, a bandage (dressing) may be applied over the tape or skin adhesive strips. Sometimes, during the healing process, the skin turns black and dies. Even when this happens, the torn skin acts as a good dressing until the skin underneath gets healthier and repairs itself.  HOME CARE INSTRUCTIONS   · Change dressings once per day or as directed by your caregiver.  ¨ Gently clean the skin tear and the area around the tear using saline solution or mild soap and water.  ¨ Do not rub the injured skin dry. Let the area air dry.  ¨ Apply petroleum jelly or an antibiotic cream or ointment to keep the tear moist. This will help the wound heal. Do not allow a scab to form.  ¨ If the dressing sticks before the next dressing change, moisten it with warm soapy water and gently remove it.  · Protect the injured skin until it has healed.  · Only take over-the-counter or prescription medicines as directed by your caregiver.  · Take showers or baths using warm soapy water. Apply a new dressing after the shower or bath.  · Keep all follow-up appointments as directed by your caregiver.    SEEK IMMEDIATE MEDICAL CARE IF:   · You have redness, swelling, or increasing pain in the skin tear.  · You have pus coming from the skin tear.  · You have chills.  · You have a red streak that goes away from the skin tear.  · You have a bad smell coming from the tear or dressing.  · You have a fever or persistent symptoms for more than 2-3 days.  · You  have a fever and your symptoms suddenly get worse.  MAKE SURE YOU:  · Understand these instructions.  · Will watch this condition.  · Will get help right away if your child is not doing well or gets worse.  Document Released: 03/30/2001 Document Revised: 03/29/2012 Document Reviewed: 01/17/2012  [employer]® Patient Information ©2015 [employer]. This information is not intended to replace advice given to you by your health care provider. Make sure you discuss any questions you have with your health care provider.

## 2014-03-23 NOTE — ED Notes (Signed)
PER EMS- pt picked up from [facility] c/o un- witnessed afll about 72ft off bed this morning. Arrived to ed alert and oriented per baseline.  LAC noted on L upper head and skin tear on L cheek.  Hx of arthritis.  Pt has hx of chronic pain.  No deformities noted.  Pt is DNR.

## 2014-04-30 ENCOUNTER — Other Ambulatory Visit: Payer: Self-pay | Admitting: Family Medicine

## 2014-04-30 MED ORDER — LISINOPRIL 10 MG PO TABS: 10.0000 mg | ORAL_TABLET | Freq: Every morning | ORAL | Status: AC

## 2014-04-30 NOTE — Telephone Encounter (Signed)
Patients daughter called requesting RF for lisinopril.  Can he please get 90 day supply w/ rfs for a year?  Rx needs printed and faxed to [phone].

## 2014-05-31 ENCOUNTER — Telehealth: Payer: Self-pay

## 2014-05-31 NOTE — Telephone Encounter (Signed)
Left message for pt to call back  °

## 2014-05-31 NOTE — Telephone Encounter (Signed)
Joshua AustinSandra Parks, pt's daughter called wanting to know if she had to bring her dad in for an office visit to fill out FMLA paperwork. She needs it for her job to be able to take care of him. Or can she just drop the form off and we fill it out. Please advise. Call her at [phone].

## 2014-05-31 NOTE — Telephone Encounter (Signed)
OK to just drop off FMLA forms.

## 2014-06-07 ENCOUNTER — Other Ambulatory Visit: Payer: Self-pay | Admitting: Nurse Practitioner

## 2014-07-05 ENCOUNTER — Telehealth: Payer: Self-pay | Admitting: Internal Medicine

## 2014-07-05 NOTE — Telephone Encounter (Signed)
Joshua Gardner (with Hospice) called (0300) with concerns regarding increased "agitation".  Reports that pt is having more shallow breathing with respiratory rate of 36, pulse 91 and blood pressure 92/40.  Pulse ox low 90-94%.  She reports having had a conversation with the daughter tonight and over the last couple of days about "transitioning him to comfort measures".  States daughter was in agreement.  She is unsure if daughter would want him sent out to ER for evaluation if an acute change.  States needs something to help cam him down.  No medication in facility.  I spoke to Ms Joshua Gardner about her father's current clinical condition.  We discussed treatment options.  Discussed ER evaluation for diagnosis, etc.  She expressed that she did not want him to suffer and wanted him to "just be kept comfortable".  States "I do not want to prolong his suffering".  She desired not to transport him to the ER.  Desired medication to keep him comfortable.  I discussed options with the daughter and with Joshua Gardner (hospice nurse).  Pt is a DNR.  Will start with sublingual ativan.  [facility] and she will send ativan .5mg  (1/2 q 6 hours prn) to the facility.  Daughter in agreement with this medication and plan.  Update on pts clinical condition.  Joshua Gardner reported that he appeared to be resting a little better now.  Respirations slowing some - 28.  Blood pressure stable in the 90s.  Pulse ox 92-94%.  Daughter made aware and is on her way over to facility.  She is comfortable with the plan as outlined.  Will call back if questions or problems.    FYI.  Joshua Gardner left information for you to call her (when office opens) at [phone] for other orders to be given (to have in the facility when/if needed).    [name]

## 2014-07-09 NOTE — Telephone Encounter (Signed)
Noted  

## 2014-07-11 ENCOUNTER — Encounter: Payer: Self-pay | Admitting: Family Medicine

## 2014-07-11 DIAGNOSIS — R627 Adult failure to thrive: Secondary | ICD-10-CM | POA: Insufficient documentation

## 2014-07-11 DIAGNOSIS — F03C0 Unspecified dementia, severe, without behavioral disturbance, psychotic disturbance, mood disturbance, and anxiety: Secondary | ICD-10-CM | POA: Insufficient documentation

## 2014-07-11 DIAGNOSIS — F0390 Unspecified dementia without behavioral disturbance: Secondary | ICD-10-CM | POA: Insufficient documentation

## 2014-07-17 ENCOUNTER — Telehealth: Payer: Self-pay

## 2014-07-17 NOTE — Telephone Encounter (Signed)
[place] [address] [place]

## 2014-07-19 DEATH — deceased

## 2014-08-20 ENCOUNTER — Ambulatory Visit: Admitting: Neurology

## 2014-08-23 ENCOUNTER — Ambulatory Visit: Admitting: Neurology

## 2014-11-16 IMAGING — CT CT HEAD W/O CM
4 of 5 series · 13 of 30 positions shown, 15 images · non-contrast
Comparison: 07/05/2012

CT HEAD

CLINICAL DATA: Fall

CT HEAD WITHOUT CONTRAST
CT CERVICAL SPINE WITHOUT CONTRAST
TECHNIQUE: Multidetector CT imaging of the head and cervical spine
was performed following the standard protocol without intravenous
contrast.  Multiplanar CT image reconstructions of the cervical
spine were also generated.

[Series 2: head w/o · axial · non-contrast · 0.41mm/px · z∈[-224,-174]mm · 2 of 31 slices shown]
[im 11/31  brain]
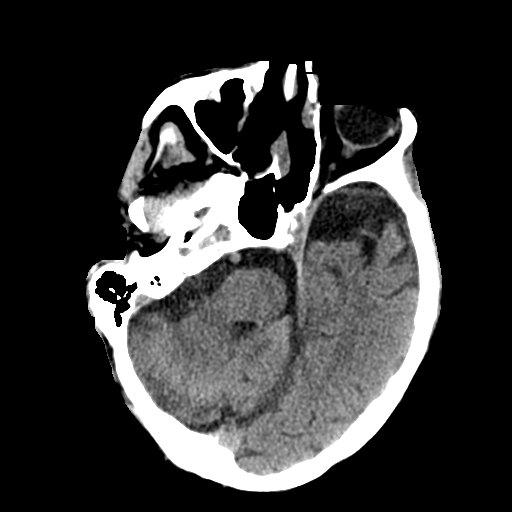
[im 21/31  brain]
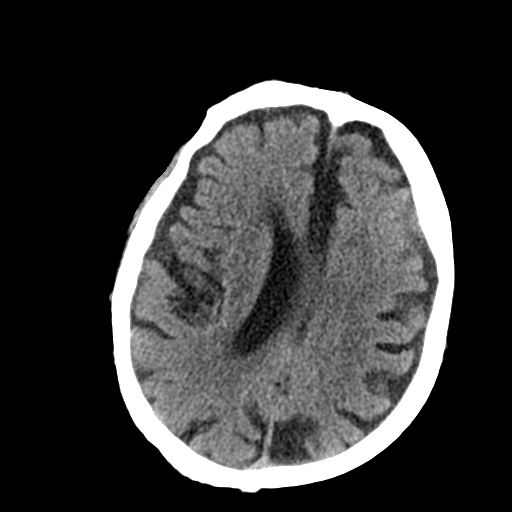

[Series 3: bone windows · axial · 0.41mm/px · z∈[-224,-174]mm · 2 of 31 slices shown]
[im 11/31  bone]
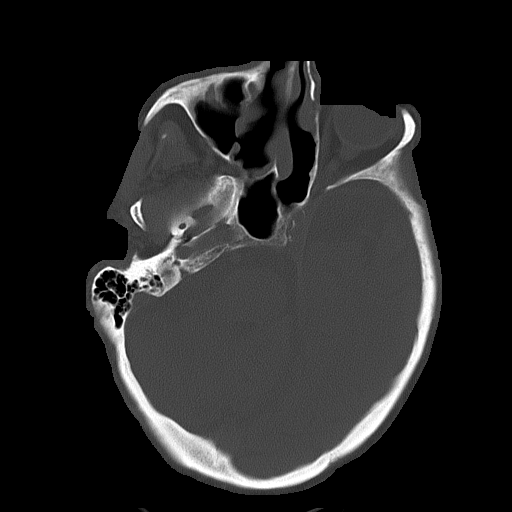
[im 21/31  bone]
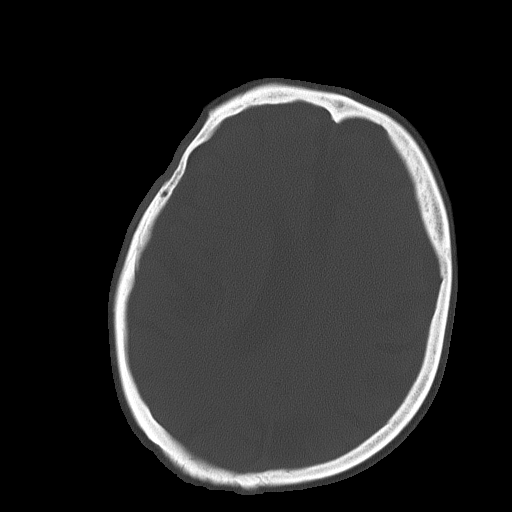

[Series 7: axial recon · axial · 0.23mm/px · z∈[-373,-265]mm · 7 of 73 slices shown, 9 images]
[im 10/73  brain]
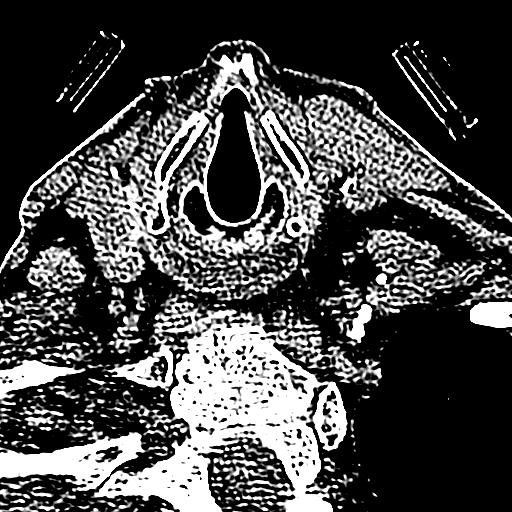
[im 10/73  bone]
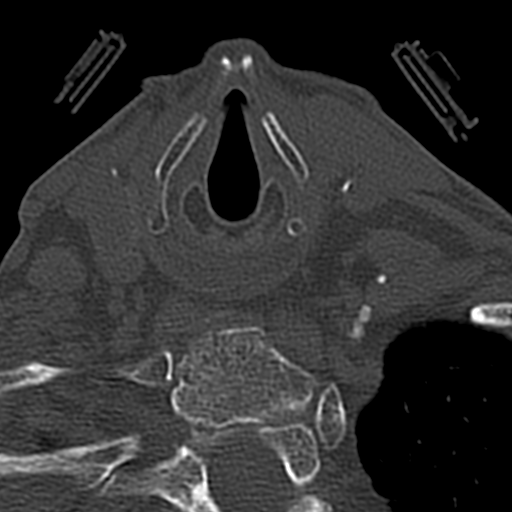
[im 19/73  brain]
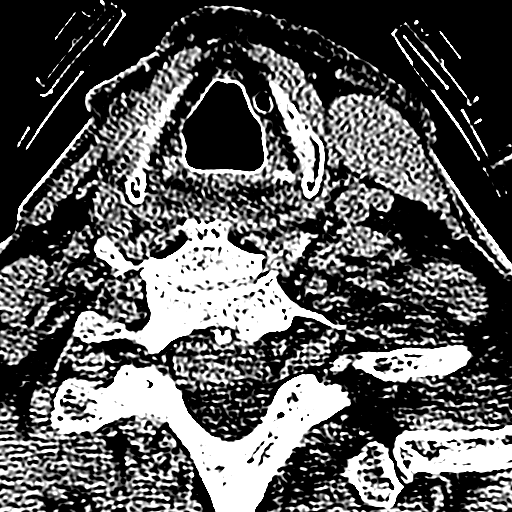
[im 28/73  brain]
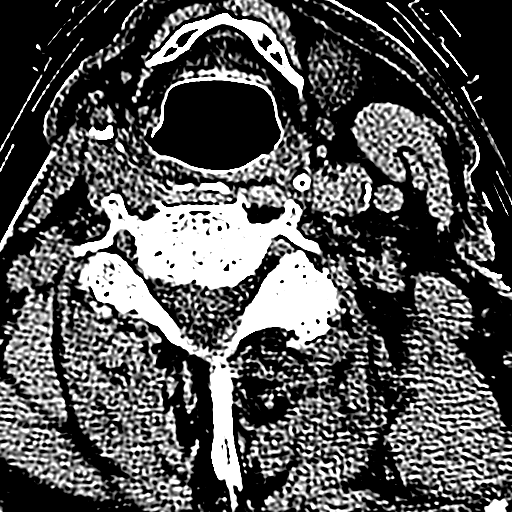
[im 37/73  brain]
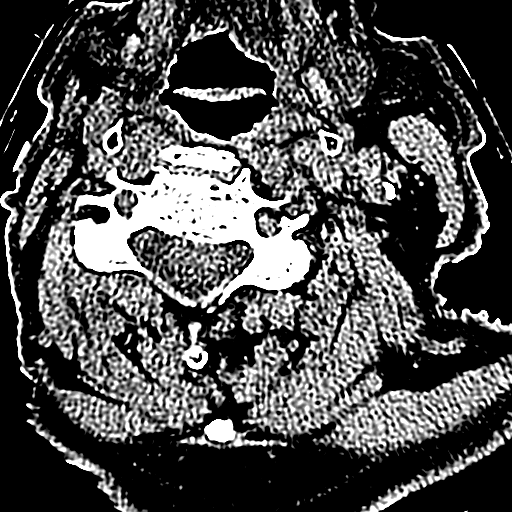
[im 46/73  brain]
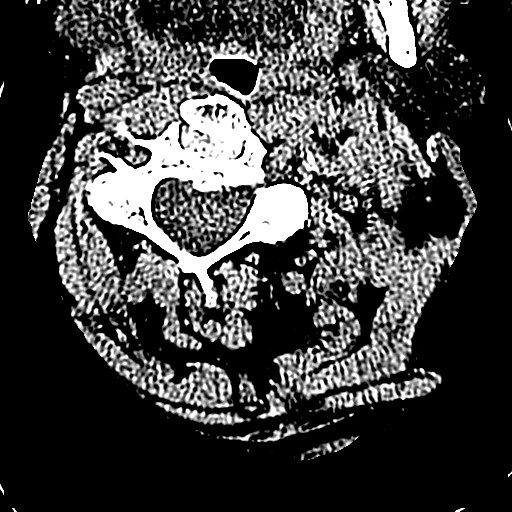
[im 46/73  bone]
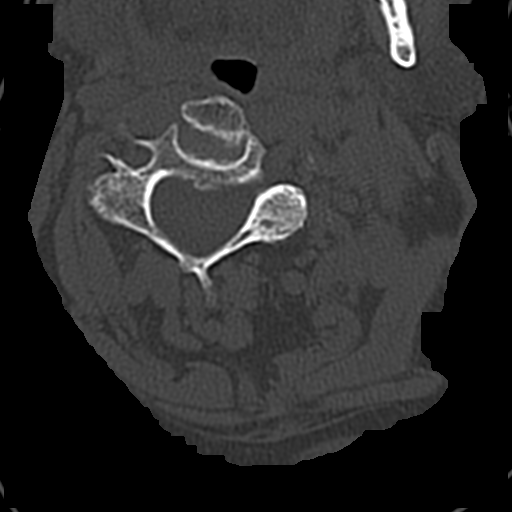
[im 55/73  brain]
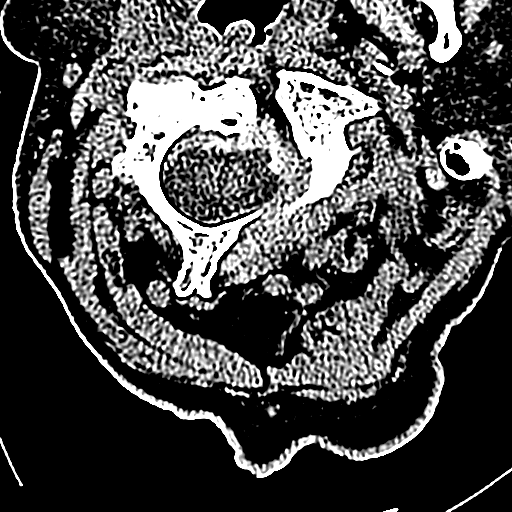
[im 64/73  brain]
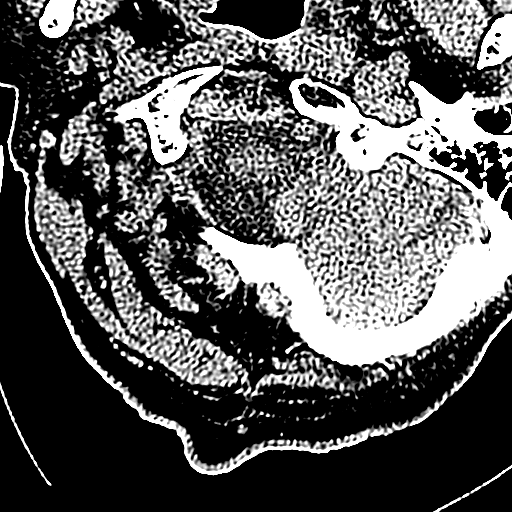

[Series 11: refor brain · axial · 0.41mm/px · z∈[-169,-119]mm · 2 of 33 slices shown]
[im 11/33  brain]
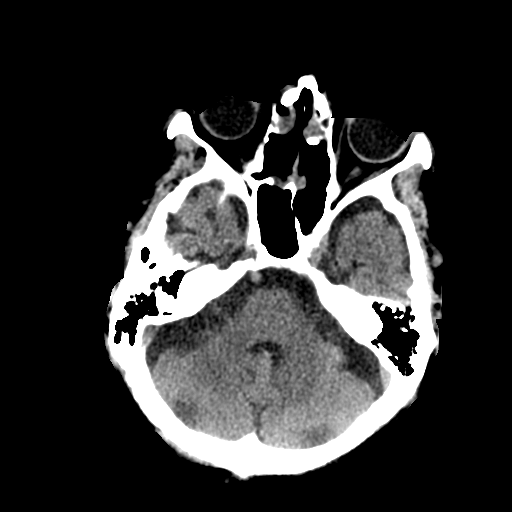
[im 22/33  brain]
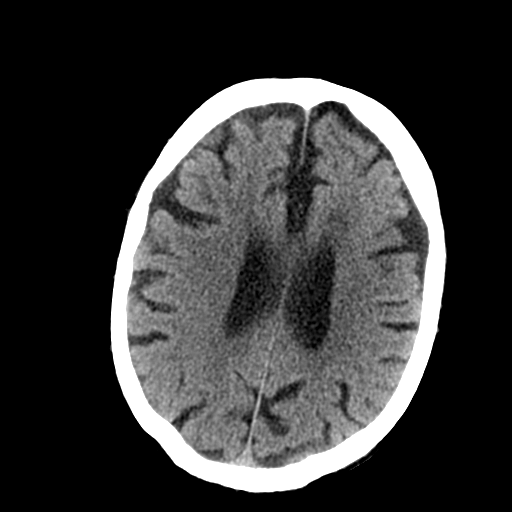

[13 of 30 positions shown; findings below may reference images not displayed]

FINDINGS: Extensive global atrophy.  Chronic ischemic changes in
the periventricular white matter, brain stem and left cerebellar
white matter.  There is no mass effect, midline shift, or acute
intracranial hemorrhage.  Chronic changes in the paranasal sinuses
are stable.  Post-traumatic changes in the left maxillary sinus are
also stable and chronic.  No definite acute fracture.
IMPRESSION: Chronic changes.  No acute intracranial pathology.

CT CERVICAL SPINE
FINDINGS: No acute fracture.  No dislocation.  Severe degenerative
changes throughout the cervical spine are not significantly
changed.  No obvious soft tissue injury.
IMPRESSION: No acute bony injury.  Degenerative changes.
